# Patient Record
Sex: Female | Born: 1980 | Race: White | Hispanic: No | Marital: Married | Smoking: Never smoker
Health system: Southern US, Community
[De-identification: ages and names within clinical notes are randomized; demographics above are authoritative.]

## PROBLEM LIST (undated history)

## (undated) DIAGNOSIS — E079 Disorder of thyroid, unspecified: Secondary | ICD-10-CM

## (undated) HISTORY — DX: Disorder of thyroid, unspecified: E07.9

---

## 2007-04-22 ENCOUNTER — Inpatient Hospital Stay (HOSPITAL_COMMUNITY): Admission: RE | Admit: 2007-04-22 | Discharge: 2007-04-25 | Payer: Self-pay | Admitting: *Deleted

## 2007-09-13 ENCOUNTER — Ambulatory Visit: Payer: Self-pay | Admitting: Internal Medicine

## 2007-09-13 DIAGNOSIS — E039 Hypothyroidism, unspecified: Secondary | ICD-10-CM | POA: Insufficient documentation

## 2007-09-13 DIAGNOSIS — J45909 Unspecified asthma, uncomplicated: Secondary | ICD-10-CM | POA: Insufficient documentation

## 2007-09-18 LAB — CONVERTED CEMR LAB: TSH: 0.06 microintl units/mL — ABNORMAL LOW (ref 0.35–5.50)

## 2007-11-20 ENCOUNTER — Ambulatory Visit: Payer: Self-pay | Admitting: Internal Medicine

## 2007-11-21 LAB — CONVERTED CEMR LAB
Free T4: 0.9 ng/dL (ref 0.6–1.6)
TSH: 2.42 microintl units/mL (ref 0.35–5.50)

## 2008-06-16 ENCOUNTER — Telehealth: Payer: Self-pay | Admitting: Internal Medicine

## 2009-01-18 ENCOUNTER — Ambulatory Visit: Payer: Self-pay | Admitting: Internal Medicine

## 2009-10-11 ENCOUNTER — Ambulatory Visit: Payer: Self-pay | Admitting: Internal Medicine

## 2009-10-11 DIAGNOSIS — R5381 Other malaise: Secondary | ICD-10-CM | POA: Insufficient documentation

## 2009-10-11 DIAGNOSIS — R5383 Other fatigue: Secondary | ICD-10-CM

## 2009-10-13 LAB — CONVERTED CEMR LAB
Albumin: 4.2 g/dL (ref 3.5–5.2)
Alkaline Phosphatase: 43 units/L (ref 39–117)
Basophils Absolute: 0 10*3/uL (ref 0.0–0.1)
Basophils Relative: 0.1 % (ref 0.0–3.0)
Bilirubin, Direct: 0.1 mg/dL (ref 0.0–0.3)
Calcium: 9.3 mg/dL (ref 8.4–10.5)
Creatinine, Ser: 0.9 mg/dL (ref 0.4–1.2)
Eosinophils Absolute: 0.2 10*3/uL (ref 0.0–0.7)
Glucose, Bld: 86 mg/dL (ref 70–99)
Lymphocytes Relative: 26.9 % (ref 12.0–46.0)
MCHC: 33.9 g/dL (ref 30.0–36.0)
MCV: 90.8 fL (ref 78.0–100.0)
Monocytes Absolute: 0.5 10*3/uL (ref 0.1–1.0)
Neutrophils Relative %: 63.4 % (ref 43.0–77.0)
Phosphorus: 3.3 mg/dL (ref 2.3–4.6)
Platelets: 278 10*3/uL (ref 150.0–400.0)
Potassium: 4.2 meq/L (ref 3.5–5.1)
RBC: 4.09 M/uL (ref 3.87–5.11)
RDW: 13.4 % (ref 11.5–14.6)
Sed Rate: 9 mm/hr (ref 0–22)
Sodium: 140 meq/L (ref 135–145)
Total Bilirubin: 0.5 mg/dL (ref 0.3–1.2)
Total Protein: 7.3 g/dL (ref 6.0–8.3)

## 2009-10-18 ENCOUNTER — Encounter: Payer: Self-pay | Admitting: Internal Medicine

## 2009-12-13 ENCOUNTER — Encounter: Payer: Self-pay | Admitting: Internal Medicine

## 2010-03-08 ENCOUNTER — Ambulatory Visit: Payer: Self-pay | Admitting: Internal Medicine

## 2010-04-26 NOTE — Assessment & Plan Note (Signed)
Summary: FOLLOW UP WITH THYROID/ lb   Vital Signs:  Patient profile:   30 year old female Weight:      146 pounds Temp:     98.2 degrees F oral Pulse rate:   68 / minute Pulse rhythm:   regular BP sitting:   112 / 78  (left arm) Cuff size:   regular  Vitals Entered By: Lowella Petties CMA (October 11, 2009 4:45 PM) CC: Follow up with thyroid   History of Present Illness: Still feels that she may be hypothyroid  She notes a lack of energy--feels "drained" all the time takes a nap every day sleeps okay at night but has trouble initiating appetite is okay--has decreased since eating less Not overtly depressed--upset about fatigue and weight gain Not anhedonic--really enjoys her kids marriage is good  Notes a "brain fog" at times.  Hard to concentrate and gets a Runner, broadcasting/film/video"  Concerned that she is gaining weight clothes don't fit  Very heavy periods may go through supersized tampons in 1 hour Bleeds for 5 days every 24 days Gyn exam benign  Allergies (verified): No Known Drug Allergies  Past History:  Past medical, surgical, family and social histories (including risk factors) reviewed for relevance to current acute and chronic problems.  Past Medical History: Reviewed history from 09/13/2007 and no changes required. Hypothyroidism Asthma  Past Surgical History: Reviewed history from 09/13/2007 and no changes required. Wisdom teeth C-section x 3  Family History: Reviewed history from 09/13/2007 and no changes required. Doesn't know Dad Mom generally healthy. Some skin cancer 1 half brother, 1 half sister--has anemia No CAD, DM, HTN Mat GM died of pancreatic cancer Mat GF died of lymphoma No breast or colon cancer  Social History: Reviewed history from 09/13/2007 and no changes required. 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   Mouth:  no erythema, no exudates, and no lesions.   Neck:  supple, no masses, no thyromegaly, and no cervical lymphadenopathy.   Lungs:  normal respiratory effort, no intercostal retractions, no accessory muscle use, and normal breath sounds.   Heart:  normal rate, regular rhythm, no murmur, and no gallop.   Abdomen:  soft, non-tender, no masses, no hepatomegaly, and no splenomegaly.   Msk:  no joint tenderness and no joint swelling.   Pulses:  normal in feet Extremities:  no edema Skin:  no suspicious lesions.  Non specific scattered papules Psych:  normally interactive, good eye contact, not anxious appearing, and not depressed appearing.     Impression & Recommendations:  Problem # 1:  FATIGUE (ICD-780.79) Assessment New  multiple symptoms which are non specific could be inadequate thyroid replacement or anemia (with hypermenorrhea) will check labs doesn't seem to be from affective source  Orders: Venipuncture (56213) TLB-Renal Function Panel (80069-RENAL) TLB-CBC Platelet - w/Differential (85025-CBCD) TLB-Hepatic/Liver Function Pnl (80076-HEPATIC) TLB-Sedimentation Rate (ESR) (85652-ESR)  Problem # 2:  HYPOTHYROIDISM (ICD-244.9) Assessment: Unchanged  due for labs will adjust meds as needed   Her updated medication list for this problem includes:    Levothyroxine Sodium 50 Mcg Tabs (Levothyroxine sodium) .Marland Kitchen... Take 1 tablet daily  Labs Reviewed: TSH: 2.42 (11/20/2007)     Orders:  TLB-TSH (Thyroid Stimulating Hormone) (84443-TSH) TLB-T4 (Thyrox), Free [phone])  Complete Medication List: 1)  Levothyroxine Sodium 50 Mcg Tabs (Levothyroxine sodium) .... Take 1 tablet daily  Patient Instructions: 1)  Please schedule a follow-up appointment as needed .  2)  Please call  for reevaluation if not feeling better in the next 1-2 months 3)  Please try a daily multivitamin  Prior Medications (reviewed today): LEVOTHYROXINE SODIUM 50 MCG  TABS (LEVOTHYROXINE SODIUM) take 1 tablet daily Current Allergies (reviewed today): No known allergies

## 2010-04-26 NOTE — Letter (Signed)
Summary: [facility]  [facility]   Imported By: Maryln Gottron 12/27/2009 10:40:55  _____________________________________________________________________  External Attachment:    Type:   Image     Comment:   External Document  Appended Document: [facility] doing better on combo cytomel and levothyroxine

## 2010-04-26 NOTE — Consult Note (Signed)
Summary: [facility] Endocrinology  [facility] Endocrinology   Imported By: Lanelle Bal 10/25/2009 10:34:51  _____________________________________________________________________  External Attachment:    Type:   Image     Comment:   External Document  Appended Document: [facility] Endocr[facility]ology adding cytomel to the levothyroxine

## 2010-04-28 NOTE — Assessment & Plan Note (Signed)
Summary: COUGH,CONGESTION/CLE   Vital Signs:  Patient profile:   30 year old female Height:      67 inches Weight:      145.75 pounds BMI:     22.91 Temp:     97.8 degrees F oral Pulse rate:   76 / minute Pulse rhythm:   regular BP sitting:   108 / 78  (left arm) Cuff size:   regular  Vitals Entered By: Selena Batten Dance CMA Duncan Dull) (March 08, 2010 11:21 AM) CC: Cough and congestion >1 week   History of Present Illness: CC: cough  1 wk h/o cough.  Feels irritation in chest.  + deep cough.  No cold sxs to start out.  + more tired recently.  drinking plenty of water.  Cough worse at night.  (last night kept up coughing).  feels dry mostly but some productive.    No fevers/chills, abd pain, n/v, rashes, myalgias, arthralgias, ST, congestion, RN, sneezing.  no smokers at home.  + children at home, one sick.  husband just finished med for bronchitis.    + h/o asthma, h/o allergies. uses inhaler rarely.  Current Medications (verified): 1)  Levothroid 25 Mcg Tabs (Levothyroxine Sodium) .Marland Kitchen.. 1 By Mouth Once Daily 2)  Cytomel 5 Mcg Tabs (Liothyronine Sodium) .Marland Kitchen.. 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   Head:  mild maxillary sinus tenderness Eyes:  PERRLA, EOMI, no injection Ears:  R ear normal and L ear normal.   Nose:  mild congestion Mouth:  no erythema, no exudates, and no lesions.   Neck:  supple, no masses, no thyromegaly, and no cervical lymphadenopathy.   Lungs:  normal respiratory effort, no intercostal retractions, no accessory muscle use, and normal breath sounds.   Heart:  normal rate, regular rhythm, no murmur, and no  gallop.   Pulses:  2+ rad pulses Extremities:  no pedal edema Skin:  no suspicious lesions.  Non specific scattered papules   Impression & Recommendations:  Problem # 1:  COUGH (ICD-786.2) likely post viral given recent sick contacts (despite lack of typical prodrome x fatigue).  no indications of allergic or GERD.  supportive care for now, red flags to return discussed.  Complete Medication List: 1)  Levothroid 25 Mcg Tabs (Levothyroxine sodium) .Marland Kitchen.. 1 by mouth once daily 2)  Cytomel 5 Mcg Tabs (Liothyronine sodium) .Marland Kitchen.. 1 by mouth once daily 3)  Ventolin Hfa 108 (90 Base) Mcg/act Aers (Albuterol sulfate) .... 2 puffs q4-6 hours as needed cough/wheezing 4)  Cheratussin Ac 100-10 Mg/18ml Syrp (Guaifenesin-codeine) .... One teaspoon at bedtime as needed cough  Patient Instructions: 1)  Sounds like you have post-viral cough. 2)  Antibiotics are not needed for this.  Viral infections usually take 7-10 days to resolve.  The cough can last 4 weeks to go away. 3)  Use medication as prescribed: cheratussin for night time, mucinex or guaifenesin during day with plenty of fluid to mobilize any mucous that needs to come out. 4)  Use albuterol once or twice a day for next few days. 5)  Push fluids and plenty of rest. 6)  Please return if you are not improving as  expected, or if you have high fevers (>101.5) or difficulty swallowing. 7)  Call clinic with questions.  Pleasure to see you today!  Prescriptions: CHERATUSSIN AC 100-10 MG/5ML SYRP (GUAIFENESIN-CODEINE) one teaspoon at bedtime as needed cough  #100cc x 0   Entered and Authorized by:   Eustaquio Boyden  MD   Signed by:   Eustaquio Boyden  MD on 03/08/2010   Method used:   Print then Give to Patient   RxID:   [id number] VENTOLIN HFA 108 (90 BASE) MCG/ACT AERS (ALBUTEROL SULFATE) 2 puffs q4-6 hours as needed cough/wheezing  #1 x 3   Entered and Authorized by:   Eustaquio Boyden  MD   Signed by:   Eustaquio Boyden  MD on 03/08/2010    Method used:   Electronically to        [facility]  [address] (retail)       [address], [address]       [place], [place]  [address]       Ph: [phone]       Fax: [phone]   RxID:   [id number]    Orders Added: 1)  Est. Patient Level III [09323]    Current Allergies (reviewed today): No known allergies

## 2010-07-08 ENCOUNTER — Telehealth: Payer: Self-pay | Admitting: *Deleted

## 2010-07-08 NOTE — Telephone Encounter (Signed)
Pt was referred to an endocrinologist at [facility] for her thyroid problems, but she would like to see Leretha Pol at [facility] endocrin clinic.  She is asking for [place] to refer her.  Phone is [phone].

## 2010-07-08 NOTE — Telephone Encounter (Signed)
Will send to [facility].

## 2010-07-08 NOTE — Telephone Encounter (Signed)
Referral faxed to [facility] Endocrinology Dept. Patient was called and told to call them to schedule her own appt.

## 2010-08-09 NOTE — Op Note (Signed)
NAMEJAIMEY, [facility]             ACCOUNT NO.:  [id number]   MEDICAL RECORD NO.:  [id number]          PATIENT TYPE:  INP   LOCATION:  9142                          FACILITY:  [facility]   PHYSICIAN:  Gerri Spore B. Earlene Plater, M.D.  DATE OF BIRTH:  [birth year]   DATE OF PROCEDURE:  04/22/2007  DATE OF DISCHARGE:                               OPERATIVE REPORT   PREOPERATIVE DIAGNOSES:  1. 39-week intrauterine pregnancy.  2. Previous cesarean section.   POSTOPERATIVE DIAGNOSES:  1. 39-week intrauterine pregnancy.  2. Previous cesarean section.   PROCEDURE:  Repeat low transverse C-section.   SURGEON:  Marina Gravel, MD.   ASSISTANT:  Maxie Better, MD.   ANESTHESIA:  Spinal.   FINDINGS:  Viable female, 9 and 9 Apgars, 6 pounds 15 ounces. Normal-  appearing uterus, tubes and ovaries, minimal scarring of the bladder  flap to the lower uterine segment.   INDICATIONS:  Patient with above history for repeat C-section advised of  the risks of surgery including infection, bleeding, damage to  surrounding organs.   DESCRIPTION OF PROCEDURE:  The patient was taken to the operating room,  spinal anesthesia obtained.  She was prepped and draped in standard  fashion. A Foley catheter inserted to the bladder.  A Pfannenstiel  incision made and carried sharply to the fascia.  The fascia was divided  sharply and the underlying rectus muscles dissected off sharply.  The  posterior sheath elevated and entered sharply.  Bladder blade inserted,  bladder flap created sharply. Minimal scar tissue noted from bladder to  lower uterine segment.   The uterine incision made in low transverse fashion with a knife.  Clear  fluid and the amniotomy extended laterally bluntly.  Vertex elevated to  the incision and delivered with fundal pressure.  Nose and mouth  suctioned with the bulb, the remainder of the infant delivered without  difficulty.  The cord clamped and cut. The infant handed off to the  waiting  pediatricians.  1 gram Ancef given at cord clamp.   The placenta was removed by uterine massage.  The uterus was cleared of  all clots and debris.  The uterine incision was free of extension.  It  closed in one layer with running lock stitch of #0 chromic with  hemostasis obtained.  Tubes and ovaries were normal to palpation.   The pelvis with irrigated, uterine incision reinspected.  One bleeder  noted in the midline,  made hemostatic with a Bovie.   The fascia was closed with a running stitch of #0 Vicryl.  The  subcutaneous tissue was irrigated and made hemostatic with the Bovie.  The skin was closed with staples.   The patient tolerated the procedure well with no complications.  She was  taken to the recovery room awake, alert in stable condition.  All counts  correct per the operating staff.      Gerri Spore B. Earlene Plater, M.D.  Electronically Signed     WBD/MEDQ  D:  04/22/2007  T:  04/22/2007  Job:  [id number]

## 2010-08-09 NOTE — Discharge Summary (Signed)
NAMEDENICE, CARDON             ACCOUNT NO.:  [id number]   MEDICAL RECORD NO.:  [id number]          PATIENT TYPE:  INP   LOCATION:  9142                          FACILITY:  [facility]   PHYSICIAN:  Gerri Spore B. Earlene Plater, M.D.  DATE OF BIRTH:  1980/10/27   DATE OF ADMISSION:  04/22/2007  DATE OF DISCHARGE:  04/25/2007                               DISCHARGE SUMMARY   ADMISSION DIAGNOSES:  Previous C-section 39-week intrauterine pregnancy.   DISCHARGE DIAGNOSES:  Previous C-section 39-week intrauterine pregnancy.   PROCEDURE:  Repeat low transverse cesarean section.   HISTORY OF PRESENT ILLNESS:  A 30 year old white female, gravida 3, para  2, 39 weeks,  two previous C-sections presents for repeat cesarean  section.   HOSPITAL COURSE:  The patient was admitted and underwent repeat low  transverse C-section.  Findings at the time of surgery included a viable  female, Apgars 9 and 9, 6 pounds 15 ounces, normal appearing uterus,  tubes and ovaries.   Postoperatively the patient rapidly regained her ability to ambulate,  void and tolerate a regular diet.  She was discharged home on the third  postoperative day in satisfactory condition.   DISCHARGE INSTRUCTIONS:  Per booklet.   DISCHARGE MEDICATIONS:  1. Tylox 1-2 p.o. q. 4-6 hours p.r.n. pain.  2. Ferrous sulfate 325 mg p.o. daily.  3. Augmentin 875 one b.i.d. to complete a 10-day course for sinusitis.      Gerri Spore B. Earlene Plater, M.D.  Electronically Signed     WBD/MEDQ  D:  04/25/2007  T:  04/25/2007  Job:  [id number]

## 2010-10-26 HISTORY — PX: LAPAROSCOPIC APPENDECTOMY: SUR753

## 2010-11-18 ENCOUNTER — Encounter: Payer: Self-pay | Admitting: Internal Medicine

## 2010-12-16 LAB — DIFFERENTIAL
Eosinophils Absolute: 0.1
Eosinophils Relative: 1
Lymphocytes Relative: 10 — ABNORMAL LOW
Lymphs Abs: 1.3
Monocytes Relative: 4

## 2010-12-16 LAB — CBC
HCT: 28.1 — ABNORMAL LOW
HCT: 35.1 — ABNORMAL LOW
Hemoglobin: 11.8 — ABNORMAL LOW
MCV: 89.2
Platelets: 222
Platelets: 265
RBC: 3.94
WBC: 10.2
WBC: 12.6 — ABNORMAL HIGH

## 2010-12-16 LAB — RPR: RPR Ser Ql: NONREACTIVE

## 2011-04-28 LAB — HM PAP SMEAR: HM Pap smear: NORMAL

## 2011-05-29 ENCOUNTER — Encounter: Payer: Self-pay | Admitting: Internal Medicine

## 2013-06-25 ENCOUNTER — Ambulatory Visit (INDEPENDENT_AMBULATORY_CARE_PROVIDER_SITE_OTHER): Payer: BC Managed Care – HMO | Admitting: Internal Medicine

## 2013-06-25 ENCOUNTER — Encounter: Payer: Self-pay | Admitting: Internal Medicine

## 2013-06-25 VITALS — BP 102/68 | HR 76 | Temp 100.0°F | Wt 132.0 lb

## 2013-06-25 DIAGNOSIS — T3695XA Adverse effect of unspecified systemic antibiotic, initial encounter: Secondary | ICD-10-CM

## 2013-06-25 DIAGNOSIS — J020 Streptococcal pharyngitis: Secondary | ICD-10-CM

## 2013-06-25 DIAGNOSIS — B379 Candidiasis, unspecified: Secondary | ICD-10-CM

## 2013-06-25 LAB — POCT RAPID STREP A (OFFICE): Rapid Strep A Screen: NEGATIVE

## 2013-06-25 MED ORDER — LIDOCAINE VISCOUS 2 % MT SOLN: 20.0000 mL | OROMUCOSAL | Status: DC | PRN

## 2013-06-25 MED ORDER — FLUCONAZOLE 150 MG PO TABS: 150.0000 mg | ORAL_TABLET | Freq: Once | ORAL | Status: DC

## 2013-06-25 MED ORDER — AMOXICILLIN 500 MG PO CAPS: 500.0000 mg | ORAL_CAPSULE | Freq: Three times a day (TID) | ORAL | Status: DC

## 2013-06-25 NOTE — Patient Instructions (Addendum)
Strep Throat  Strep throat is an infection of the throat caused by a bacteria named Streptococcus pyogenes. Your caregiver may call the infection streptococcal "tonsillitis" or "pharyngitis" depending on whether there are signs of inflammation in the tonsils or back of the throat. Strep throat is most common in children aged 35 15 years during the cold months of the year, but it can occur in people of any age during any season. This infection is spread from person to person (contagious) through coughing, sneezing, or other close contact.  SYMPTOMS   · Fever or chills.  · Painful, swollen, red tonsils or throat.  · Pain or difficulty when swallowing.  · White or yellow spots on the tonsils or throat.  · Swollen, tender lymph nodes or "glands" of the neck or under the jaw.  · Red rash all over the body (rare).  DIAGNOSIS   Many different infections can cause the same symptoms. A test must be done to confirm the diagnosis so the right treatment can be given. A "rapid strep test" can help your caregiver make the diagnosis in a few minutes. If this test is not available, a light swab of the infected area can be used for a throat culture test. If a throat culture test is done, results are usually available in a day or two.  TREATMENT   Strep throat is treated with antibiotic medicine.  HOME CARE INSTRUCTIONS   · Gargle with 1 tsp of salt in 1 cup of warm water, 3 4 times per day or as needed for comfort.  · Family members who also have a sore throat or fever should be tested for strep throat and treated with antibiotics if they have the strep infection.  · Make sure everyone in your household washes their hands well.  · Do not share food, drinking cups, or personal items that could cause the infection to spread to others.  · You may need to eat a soft food diet until your sore throat gets better.  · Drink enough water and fluids to keep your urine clear or pale yellow. This will help prevent dehydration.  · Get plenty of  rest.  · Stay home from school, daycare, or work until you have been on antibiotics for 24 hours.  · Only take over-the-counter or prescription medicines for pain, discomfort, or fever as directed by your caregiver.  · If antibiotics are prescribed, take them as directed. Finish them even if you start to feel better.  SEEK MEDICAL CARE IF:   · The glands in your neck continue to enlarge.  · You develop a rash, cough, or earache.  · You cough up green, yellow-brown, or bloody sputum.  · You have pain or discomfort not controlled by medicines.  · Your problems seem to be getting worse rather than better.  SEEK IMMEDIATE MEDICAL CARE IF:   · You develop any new symptoms such as vomiting, severe headache, stiff or painful neck, chest pain, shortness of breath, or trouble swallowing.  · You develop severe throat pain, drooling, or changes in your voice.  · You develop swelling of the neck, or the skin on the neck becomes red and tender.  · You have a fever.  · You develop signs of dehydration, such as fatigue, dry mouth, and decreased urination.  · You become increasingly sleepy, or you cannot wake up completely.  Document Released: 03/10/2000 Document Revised: 02/28/2012 Document Reviewed: 05/12/2010  ExitCare® Patient Information ©2014 [employer].

## 2013-06-25 NOTE — Addendum Note (Signed)
Addended by: Roena MaladyEVONTENNO, Bryauna Byrum Y on: 06/25/2013 02:47 PM   Modules accepted: Orders

## 2013-06-25 NOTE — Progress Notes (Signed)
Pre visit review using our clinic review tool, if applicable. No additional management support is needed unless otherwise documented below in the visit note. 

## 2013-06-25 NOTE — Progress Notes (Signed)
HPI  Pt presents to the clinic today with c/o sore throat, bilateral ear pain and fever. She reports this started 4 days ago. She does have difficulty swallowing. The left ear hurts worse than the right. She has tried Ibuprofen OTC with little relief. She has no history of allergies or asthma. She has not had sick contacts.  Review of Systems      Past Medical History  Diagnosis Date  . Thyroid disease     No family history on file.  History   Social History  . Marital Status: Married    Spouse Name: N/A    Number of Children: 3  . Years of Education: N/A   Occupational History  . Not on file.   Social History Main Topics  . Smoking status: Never Smoker   . Smokeless tobacco: Not on file  . Alcohol Use: No  . Drug Use: Not on file  . Sexual Activity: Not on file   Other Topics Concern  . Not on file   Social History Narrative  . No narrative on file    No Known Allergies   Constitutional: Positive headache, fatigue and fever. Denies abrupt weight changes.  HEENT:  Positive sore throat and ear pain. Denies eye redness, eye pain, pressure behind the eyes, facial pain, nasal congestion,  ringing in the ears, wax buildup, runny nose or bloody nose. Respiratory:  Denies cough,  difficulty breathing or shortness of breath.  Cardiovascular: Denies chest pain, chest tightness, palpitations or swelling in the hands or feet.   No other specific complaints in a complete review of systems (except as listed in HPI above).  Objective:   BP 102/68  Pulse 76  Temp(Src) 100 F (37.8 C) (Oral)  Wt 132 lb (59.875 kg)  SpO2 98%  LMP 06/20/2013 Wt Readings from Last 3 Encounters:  06/25/13 132 lb (59.875 kg)  03/08/10 145 lb 12 oz (66.112 kg)  10/11/09 146 lb (66.225 kg)     General: Appears her stated age, well developed, well nourished in NAD. HEENT: Head: normal shape and size; Eyes: sclera white, no icterus, conjunctiva pink, PERRLA and EOMs intact; Ears: Tm's red  but intact, normal light reflex, + effusion bilaterally; Nose: mucosa pink and moist, septum midline; Throat/Mouth: + PND. Teeth present, mucosa erythematous and moist, tonsils 3 + with exudate noted on bilateral tonsillar pillars, no lesions or ulcerations noted.  Neck: Mild cervical lymphadenopathy. Neck supple, trachea midline. No massses, lumps or thyromegaly present.  Cardiovascular: Normal rate and rhythm. S1,S2 noted.  No murmur, rubs or gallops noted. No JVD or BLE edema. No carotid bruits noted. Pulmonary/Chest: Normal effort and positive vesicular breath sounds. No respiratory distress. No wheezes, rales or ronchi noted.      Assessment & Plan:   Sore throat:  RST: negative Get some rest and drink plenty of water Do salt water gargles for the sore throat Using Centor criteria, will treat with Amoxicillin TID x 10 days eRx for viscous lidocaine eRx for antibiotic induced yeast infection  RTC as needed or if symptoms persist.

## 2013-06-25 NOTE — Progress Notes (Signed)
HPI  Pt presents to the clinic today with c/o sore throat, otalgia, and fever. Symptoms started four days ago. She endorses having difficulty swallowing, chills, malaise, fever, and body aches. She denies cough, shortness of breath, or other cold symptoms. She has tried OTC Ibuprofen with some relief.   Review of Systems     No past medical history on file.  No family history on file.  History   Social History  . Marital Status: Married    Spouse Name: N/A    Number of Children: 3  . Years of Education: N/A   Occupational History  . Not on file.   Social History Main Topics  . Smoking status: Never Smoker   . Smokeless tobacco: Not on file  . Alcohol Use: No  . Drug Use: Not on file  . Sexual Activity: Not on file   Other Topics Concern  . Not on file   Social History Narrative  . No narrative on file    Allergies not on file   Constitutional: Positive fatigue, malaise, and fever. Denies abrupt weight changes.  HEENT:  Positive sore throat. Denies eye redness, eye pain, pressure behind the eyes, facial pain, nasal congestion, ear pain, ringing in the ears, wax buildup, runny nose or bloody nose. Respiratory: . Denies cough, difficulty breathing or shortness of breath.  Cardiovascular: Denies chest pain, chest tightness, palpitations or swelling in the hands or feet.   No other specific complaints in a complete review of systems (except as listed in HPI above).  Objective:   Wt 132 lb (59.875 kg) Wt Readings from Last 3 Encounters:  06/25/13 132 lb (59.875 kg)  03/08/10 145 lb 12 oz (66.112 kg)  10/11/09 146 lb (66.225 kg)     General: Appears his stated age, well developed, well nourished in NAD. HEENT: Head: normal shape and size; Eyes: sclera white, no icterus, conjunctiva pink, PERRLA and EOMs intact; Ears: Bilateral effusions; clear serous fluid. Light reflex distorted. Nose: mucosa pink and moist, septum midline; Throat/Mouth: + PND. Teeth present, mucosa  erythematous and moist, Tonsils 3+ bilaterally with white  exudate noted, no lesions or ulcerations noted.  Neck: Mild cervical lymphadenopathy. Neck supple, trachea midline. No massses, lumps or thyromegaly present.  Cardiovascular: Normal rate and rhythm. S1,S2 noted.  No murmur, rubs or gallops noted. No JVD or BLE edema. No carotid bruits noted. Pulmonary/Chest: Normal effort and positive vesicular breath sounds. No respiratory distress. No wheezes, rales or ronchi noted.      Assessment & Plan:   Strep throat:  Rapid strep negative; Will treat based on Centor Criteria eRx for Amoxicillin for 10days  eRx for Viscous Lidocaine for pain Gargle with warm salt water Rest and drink plenty of fluids Follow up in 3-5 days if no better  RTC as needed or if symptoms persist.  Demico Ploch, Jacques Earthlyourtney S, Student-NP

## 2013-10-10 ENCOUNTER — Encounter: Payer: Self-pay | Admitting: Internal Medicine

## 2013-10-10 ENCOUNTER — Ambulatory Visit (INDEPENDENT_AMBULATORY_CARE_PROVIDER_SITE_OTHER): Payer: Managed Care, Other (non HMO) | Admitting: Internal Medicine

## 2013-10-10 VITALS — BP 98/62 | HR 71 | Temp 97.9°F | Ht 67.0 in | Wt 136.0 lb

## 2013-10-10 DIAGNOSIS — Z0000 Encounter for general adult medical examination without abnormal findings: Secondary | ICD-10-CM

## 2013-10-10 DIAGNOSIS — E039 Hypothyroidism, unspecified: Secondary | ICD-10-CM

## 2013-10-10 DIAGNOSIS — Z111 Encounter for screening for respiratory tuberculosis: Secondary | ICD-10-CM

## 2013-10-10 NOTE — Progress Notes (Signed)
Subjective:    Patient ID: Margaret Frederick, female    DOB: 1980/12/16, 33 y.o.   MRN: [id number]  HPI  Pt presents to the clinic today for her annual exam. She would like to be tested for TB. She reports that she recently traveled to an orphanage in [place] where there may have been some children with TB. She has no s/s at this time.  Flu: never Tetanus: 2009 LMP: 09/30/13 Pap Smear: 2013 Dentist: [email or web address]  Review of Systems      Past Medical History  Diagnosis Date  . Thyroid disease     Current Outpatient Prescriptions  Medication Sig Dispense Refill  . levothyroxine (SYNTHROID, LEVOTHROID) 50 MCG tablet Take 50 mcg by mouth daily before breakfast. 2 days a week--take 2 tablets       No current facility-administered medications for this visit.    No Known Allergies  No family history on file.  History   Social History  . Marital Status: Married    Spouse Name: N/A    Number of Children: 3  . Years of Education: N/A   Occupational History  . Not on file.   Social History Main Topics  . Smoking status: Never Smoker   . Smokeless tobacco: Not on file  . Alcohol Use: No  . Drug Use: Not on file  . Sexual Activity: Not on file   Other Topics Concern  . Not on file   Social History Narrative  . No narrative on file     Constitutional: Denies fever, malaise, fatigue, headache or abrupt weight changes.  HEENT: Denies eye pain, eye redness, ear pain, ringing in the ears, wax buildup, runny nose, nasal congestion, bloody nose, or sore throat. Respiratory: Denies difficulty breathing, shortness of breath, cough or sputum production.   Cardiovascular: Denies chest pain, chest tightness, palpitations or swelling in the hands or feet.  Gastrointestinal: Denies abdominal pain, bloating, constipation, diarrhea or blood in the stool.  GU: Denies urgency, frequency, pain with urination, burning sensation, blood in urine, odor or discharge. Musculoskeletal: Denies  decrease in range of motion, difficulty with gait, muscle pain or joint pain and swelling.  Skin: Denies redness, rashes, lesions or ulcercations.  Neurological: Denies dizziness, difficulty with memory, difficulty with speech or problems with balance and coordination.   No other specific complaints in a complete review of systems (except as listed in HPI above).  Objective:   Physical Exam  BP 98/62  Pulse 71  Temp(Src) 97.9 F (36.6 C) (Oral)  Ht 5\' 7"  (1.702 m)  Wt 136 lb (61.689 kg)  BMI 21.30 kg/m2  SpO2 98%  LMP 09/30/2013 Wt Readings from Last 3 Encounters:  10/10/13 136 lb (61.689 kg)  06/25/13 132 lb (59.875 kg)  03/08/10 145 lb 12 oz (66.112 kg)    General: Appears her stated age, well developed, well nourished in NAD. Skin: Warm, dry and intact. No rashes, lesions or ulcerations noted. HEENT: Head: normal shape and size; Eyes: sclera white, no icterus, conjunctiva pink, PERRLA and EOMs intact; Ears: Tm's gray and intact, normal light reflex; Nose: mucosa pink and moist, septum midline; Throat/Mouth: Teeth present, mucosa pink and moist, no exudate, lesions or ulcerations noted.  Neck: Normal range of motion. Neck supple, trachea midline. No massses, lumps or thyromegaly present.  Cardiovascular: Normal rate and rhythm. S1,S2 noted.  No murmur, rubs or gallops noted. No JVD or BLE edema. No carotid bruits noted. Pulmonary/Chest: Normal effort and positive vesicular breath sounds. No respiratory distress.  No wheezes, rales or ronchi noted.  Abdomen: Soft and nontender. Normal bowel sounds, no bruits noted. No distention or masses noted. Liver, spleen and kidneys non palpable. Musculoskeletal: Normal range of motion. No signs of joint swelling. No difficulty with gait.  Neurological: Alert and oriented. Cranial nerves II-XII intact. Coordination normal. +DTRs bilaterally. Psychiatric: Mood and affect normal. Behavior is normal. Judgment and thought content normal.      BMET    Component Value Date/Time   NA 140 10/11/2009 1709   K 4.2 10/11/2009 1709   CL 108 10/11/2009 1709   CO2 30 10/11/2009 1709   GLUCOSE 86 10/11/2009 1709   BUN 13 10/11/2009 1709   CREATININE 0.9 10/11/2009 1709   CALCIUM 9.3 10/11/2009 1709   GFRNONAA 76.62 10/11/2009 1709    CBC    Component Value Date/Time   WBC 7.2 10/11/2009 1709   RBC 4.09 10/11/2009 1709   HGB 12.6 10/11/2009 1709   HCT 37.1 10/11/2009 1709   PLT 278.0 10/11/2009 1709   MCV 90.8 10/11/2009 1709   MCHC 33.9 10/11/2009 1709   RDW 13.4 10/11/2009 1709   LYMPHSABS 1.9 10/11/2009 1709   MONOABS 0.5 10/11/2009 1709   EOSABS 0.2 10/11/2009 1709   BASOSABS 0.0 10/11/2009 1709          Assessment & Plan:   Preventative Health Maintenance:  All HM UTD Will check CBC, CMET and lipid She does see endo for her TSH Will check for TB through serum  RTC in 1 year or sooner if needed

## 2013-10-10 NOTE — Patient Instructions (Addendum)
Preventive Care for Adults A healthy lifestyle and preventive care can promote health and wellness. Preventive health guidelines for women include the following key practices.  A routine yearly physical is a good way to check with your health care provider about your health and preventive screening. It is a chance to share any concerns and updates on your health and to receive a thorough exam.  Visit your dentist for a routine exam and preventive care every 6 months. Brush your teeth twice a day and floss once a day. Good oral hygiene prevents tooth decay and gum disease.  The frequency of eye exams is based on your age, health, family medical history, use of contact lenses, and other factors. Follow your health care provider's recommendations for frequency of eye exams.  Eat a healthy diet. Foods like vegetables, fruits, whole grains, low-fat dairy products, and lean protein foods contain the nutrients you need without too many calories. Decrease your intake of foods high in solid fats, added sugars, and salt. Eat the right amount of calories for you.Get information about a proper diet from your health care provider, if necessary.  Regular physical exercise is one of the most important things you can do for your health. Most adults should get at least 150 minutes of moderate-intensity exercise (any activity that increases your heart rate and causes you to sweat) each week. In addition, most adults need muscle-strengthening exercises on 2 or more days a week.  Maintain a healthy weight. The body mass index (BMI) is a screening tool to identify possible weight problems. It provides an estimate of body fat based on height and weight. Your health care provider can find your BMI, and can help you achieve or maintain a healthy weight.For adults 20 years and older:  A BMI below 18.5 is considered underweight.  A BMI of 18.5 to 24.9 is normal.  A BMI of 25 to 29.9 is considered overweight.  A BMI of  30 and above is considered obese.  Maintain normal blood lipids and cholesterol levels by exercising and minimizing your intake of saturated fat. Eat a balanced diet with plenty of fruit and vegetables. Blood tests for lipids and cholesterol should begin at age 52 and be repeated every 5 years. If your lipid or cholesterol levels are high, you are over 50, or you are at high risk for heart disease, you may need your cholesterol levels checked more frequently.Ongoing high lipid and cholesterol levels should be treated with medicines if diet and exercise are not working.  If you smoke, find out from your health care provider how to quit. If you do not use tobacco, do not start.  Lung cancer screening is recommended for adults aged 37-80 years who are at high risk for developing lung cancer because of a history of smoking. A yearly low-dose CT scan of the lungs is recommended for people who have at least a 30-pack-year history of smoking and are a current smoker or have quit within the past 15 years. A pack year of smoking is smoking an average of 1 pack of cigarettes a day for 1 year (for example: 1 pack a day for 30 years or 2 packs a day for 15 years). Yearly screening should continue until the smoker has stopped smoking for at least 15 years. Yearly screening should be stopped for people who develop a health problem that would prevent them from having lung cancer treatment.  If you are pregnant, do not drink alcohol. If you are breastfeeding,  be very cautious about drinking alcohol. If you are not pregnant and choose to drink alcohol, do not have more than 1 drink per day. One drink is considered to be 12 ounces (355 mL) of beer, 5 ounces (148 mL) of wine, or 1.5 ounces (44 mL) of liquor.  Avoid use of street drugs. Do not share needles with anyone. Ask for help if you need support or instructions about stopping the use of drugs.  High blood pressure causes heart disease and increases the risk of  stroke. Your blood pressure should be checked at least every 1 to 2 years. Ongoing high blood pressure should be treated with medicines if weight loss and exercise do not work.  If you are 3-86 years old, ask your health care provider if you should take aspirin to prevent strokes.  Diabetes screening involves taking a blood sample to check your fasting blood sugar level. This should be done once every 3 years, after age 67, if you are within normal weight and without risk factors for diabetes. Testing should be considered at a younger age or be carried out more frequently if you are overweight and have at least 1 risk factor for diabetes.  Breast cancer screening is essential preventive care for women. You should practice "breast self-awareness." This means understanding the normal appearance and feel of your breasts and may include breast self-examination. Any changes detected, no matter how small, should be reported to a health care provider. Women in their 8s and 30s should have a clinical breast exam (CBE) by a health care provider as part of a regular health exam every 1 to 3 years. After age 70, women should have a CBE every year. Starting at age 25, women should consider having a mammogram (breast X-ray test) every year. Women who have a family history of breast cancer should talk to their health care provider about genetic screening. Women at a high risk of breast cancer should talk to their health care providers about having an MRI and a mammogram every year.  Breast cancer gene (BRCA)-related cancer risk assessment is recommended for women who have family members with BRCA-related cancers. BRCA-related cancers include breast, ovarian, tubal, and peritoneal cancers. Having family members with these cancers may be associated with an increased risk for harmful changes (mutations) in the breast cancer genes BRCA1 and BRCA2. Results of the assessment will determine the need for genetic counseling and  BRCA1 and BRCA2 testing.  Routine pelvic exams to screen for cancer are no longer recommended for nonpregnant women who are considered low risk for cancer of the pelvic organs (ovaries, uterus, and vagina) and who do not have symptoms. Ask your health care provider if a screening pelvic exam is right for you.  If you have had past treatment for cervical cancer or a condition that could lead to cancer, you need Pap tests and screening for cancer for at least 20 years after your treatment. If Pap tests have been discontinued, your risk factors (such as having a new sexual partner) need to be reassessed to determine if screening should be resumed. Some women have medical problems that increase the chance of getting cervical cancer. In these cases, your health care provider may recommend more frequent screening and Pap tests.  The HPV test is an additional test that may be used for cervical cancer screening. The HPV test looks for the virus that can cause the cell changes on the cervix. The cells collected during the Pap test can be  tested for HPV. The HPV test could be used to screen women aged 47 years and older, and should be used in women of any age who have unclear Pap test results. After the age of 36, women should have HPV testing at the same frequency as a Pap test.  Colorectal cancer can be detected and often prevented. Most routine colorectal cancer screening begins at the age of 38 years and continues through age 58 years. However, your health care provider may recommend screening at an earlier age if you have risk factors for colon cancer. On a yearly basis, your health care provider may provide home test kits to check for hidden blood in the stool. Use of a small camera at the end of a tube, to directly examine the colon (sigmoidoscopy or colonoscopy), can detect the earliest forms of colorectal cancer. Talk to your health care provider about this at age 64, when routine screening begins. Direct  exam of the colon should be repeated every 5-10 years through age 21 years, unless early forms of pre-cancerous polyps or small growths are found.  People who are at an increased risk for hepatitis B should be screened for this virus. You are considered at high risk for hepatitis B if:  You were born in a country where hepatitis B occurs often. Talk with your health care provider about which countries are considered high risk.  Your parents were born in a high-risk country and you have not received a shot to protect against hepatitis B (hepatitis B vaccine).  You have HIV or AIDS.  You use needles to inject street drugs.  You live with, or have sex with, someone who has Hepatitis B.  You get hemodialysis treatment.  You take certain medicines for conditions like cancer, organ transplantation, and autoimmune conditions.  Hepatitis C blood testing is recommended for all people born from [birth year] through 1965 and any individual with known risks for hepatitis C.  Practice safe sex. Use condoms and avoid high-risk sexual practices to reduce the spread of sexually transmitted infections (STIs). STIs include gonorrhea, chlamydia, syphilis, trichomonas, herpes, HPV, and human immunodeficiency virus (HIV). Herpes, HIV, and HPV are viral illnesses that have no cure. They can result in disability, cancer, and death.  You should be screened for sexually transmitted illnesses (STIs) including gonorrhea and chlamydia if:  You are sexually active and are younger than 24 years.  You are older than 24 years and your health care provider tells you that you are at risk for this type of infection.  Your sexual activity has changed since you were last screened and you are at an increased risk for chlamydia or gonorrhea. Ask your health care provider if you are at risk.  If you are at risk of being infected with HIV, it is recommended that you take a prescription medicine daily to prevent HIV infection. This is  called preexposure prophylaxis (PrEP). You are considered at risk if:  You are a heterosexual woman, are sexually active, and are at increased risk for HIV infection.  You take drugs by injection.  You are sexually active with a partner who has HIV.  Talk with your health care provider about whether you are at high risk of being infected with HIV. If you choose to begin PrEP, you should first be tested for HIV. You should then be tested every 3 months for as long as you are taking PrEP.  Osteoporosis is a disease in which the bones lose minerals and strength  with aging. This can result in serious bone fractures or breaks. The risk of osteoporosis can be identified using a bone density scan. Women ages 65 years and over and women at risk for fractures or osteoporosis should discuss screening with their health care providers. Ask your health care provider whether you should take a calcium supplement or vitamin D to reduce the rate of osteoporosis.  Menopause can be associated with physical symptoms and risks. Hormone replacement therapy is available to decrease symptoms and risks. You should talk to your health care provider about whether hormone replacement therapy is right for you.  Use sunscreen. Apply sunscreen liberally and repeatedly throughout the day. You should seek shade when your shadow is shorter than you. Protect yourself by wearing long sleeves, pants, a wide-brimmed hat, and sunglasses year round, whenever you are outdoors.  Once a month, do a whole body skin exam, using a mirror to look at the skin on your back. Tell your health care provider of new moles, moles that have irregular borders, moles that are larger than a pencil eraser, or moles that have changed in shape or color.  Stay current with required vaccines (immunizations).  Influenza vaccine. All adults should be immunized every year.  Tetanus, diphtheria, and acellular pertussis (Td, Tdap) vaccine. Pregnant women should  receive 1 dose of Tdap vaccine during each pregnancy. The dose should be obtained regardless of the length of time since the last dose. Immunization is preferred during the 27th-36th week of gestation. An adult who has not previously received Tdap or who does not know her vaccine status should receive 1 dose of Tdap. This initial dose should be followed by tetanus and diphtheria toxoids (Td) booster doses every 10 years. Adults with an unknown or incomplete history of completing a 3-dose immunization series with Td-containing vaccines should begin or complete a primary immunization series including a Tdap dose. Adults should receive a Td booster every 10 years.  Varicella vaccine. An adult without evidence of immunity to varicella should receive 2 doses or a second dose if she has previously received 1 dose. Pregnant females who do not have evidence of immunity should receive the first dose after pregnancy. This first dose should be obtained before leaving the health care facility. The second dose should be obtained 4-8 weeks after the first dose.  Human papillomavirus (HPV) vaccine. Females aged 13-26 years who have not received the vaccine previously should obtain the 3-dose series. The vaccine is not recommended for use in pregnant females. However, pregnancy testing is not needed before receiving a dose. If a female is found to be pregnant after receiving a dose, no treatment is needed. In that case, the remaining doses should be delayed until after the pregnancy. Immunization is recommended for any person with an immunocompromised condition through the age of 26 years if she did not get any or all doses earlier. During the 3-dose series, the second dose should be obtained 4-8 weeks after the first dose. The third dose should be obtained 24 weeks after the first dose and 16 weeks after the second dose.  Zoster vaccine. One dose is recommended for adults aged 60 years or older unless certain conditions are  present.  Measles, mumps, and rubella (MMR) vaccine. Adults born before 1957 generally are considered immune to measles and mumps. Adults born in 1957 or later should have 1 or more doses of MMR vaccine unless there is a contraindication to the vaccine or there is laboratory evidence of immunity to   each of the three diseases. A routine second dose of MMR vaccine should be obtained at least 28 days after the first dose for students attending postsecondary schools, health care workers, or international travelers. People who received inactivated measles vaccine or an unknown type of measles vaccine during 1963-1967 should receive 2 doses of MMR vaccine. People who received inactivated mumps vaccine or an unknown type of mumps vaccine before 1979 and are at high risk for mumps infection should consider immunization with 2 doses of MMR vaccine. For females of childbearing age, rubella immunity should be determined. If there is no evidence of immunity, females who are not pregnant should be vaccinated. If there is no evidence of immunity, females who are pregnant should delay immunization until after pregnancy. Unvaccinated health care workers born before 1957 who lack laboratory evidence of measles, mumps, or rubella immunity or laboratory confirmation of disease should consider measles and mumps immunization with 2 doses of MMR vaccine or rubella immunization with 1 dose of MMR vaccine.  Pneumococcal 13-valent conjugate (PCV13) vaccine. When indicated, a person who is uncertain of her immunization history and has no record of immunization should receive the PCV13 vaccine. An adult aged 19 years or older who has certain medical conditions and has not been previously immunized should receive 1 dose of PCV13 vaccine. This PCV13 should be followed with a dose of pneumococcal polysaccharide (PPSV23) vaccine. The PPSV23 vaccine dose should be obtained at least 8 weeks after the dose of PCV13 vaccine. An adult aged 19  years or older who has certain medical conditions and previously received 1 or more doses of PPSV23 vaccine should receive 1 dose of PCV13. The PCV13 vaccine dose should be obtained 1 or more years after the last PPSV23 vaccine dose.  Pneumococcal polysaccharide (PPSV23) vaccine. When PCV13 is also indicated, PCV13 should be obtained first. All adults aged 65 years and older should be immunized. An adult younger than age 65 years who has certain medical conditions should be immunized. Any person who resides in a nursing home or long-term care facility should be immunized. An adult smoker should be immunized. People with an immunocompromised condition and certain other conditions should receive both PCV13 and PPSV23 vaccines. People with human immunodeficiency virus (HIV) infection should be immunized as soon as possible after diagnosis. Immunization during chemotherapy or radiation therapy should be avoided. Routine use of PPSV23 vaccine is not recommended for American Indians, [place] Natives, or people younger than 65 years unless there are medical conditions that require PPSV23 vaccine. When indicated, people who have unknown immunization and have no record of immunization should receive PPSV23 vaccine. One-time revaccination 5 years after the first dose of PPSV23 is recommended for people aged 19-64 years who have chronic kidney failure, nephrotic syndrome, asplenia, or immunocompromised conditions. People who received 1-2 doses of PPSV23 before age 65 years should receive another dose of PPSV23 vaccine at age 65 years or later if at least 5 years have passed since the previous dose. Doses of PPSV23 are not needed for people immunized with PPSV23 at or after age 65 years.  Meningococcal vaccine. Adults with asplenia or persistent complement component deficiencies should receive 2 doses of quadrivalent meningococcal conjugate (MenACWY-D) vaccine. The doses should be obtained at least 2 months apart.  Microbiologists working with certain meningococcal bacteria, military recruits, people at risk during an outbreak, and people who travel to or live in countries with a high rate of meningitis should be immunized. A first-year college student up through age   21 years who is living in a residence hall should receive a dose if she did not receive a dose on or after her 16th birthday. Adults who have certain high-risk conditions should receive one or more doses of vaccine.  Hepatitis A vaccine. Adults who wish to be protected from this disease, have certain high-risk conditions, work with hepatitis A-infected animals, work in hepatitis A research labs, or travel to or work in countries with a high rate of hepatitis A should be immunized. Adults who were previously unvaccinated and who anticipate close contact with an international adoptee during the first 60 days after arrival in the [place] from a country with a high rate of hepatitis A should be immunized.  Hepatitis B vaccine. Adults who wish to be protected from this disease, have certain high-risk conditions, may be exposed to blood or other infectious body fluids, are household contacts or sex partners of hepatitis B positive people, are clients or workers in certain care facilities, or travel to or work in countries with a high rate of hepatitis B should be immunized.  Haemophilus influenzae type b (Hib) vaccine. A previously unvaccinated person with asplenia or sickle cell disease or having a scheduled splenectomy should receive 1 dose of Hib vaccine. Regardless of previous immunization, a recipient of a hematopoietic stem cell transplant should receive a 3-dose series 6-12 months after her successful transplant. Hib vaccine is not recommended for adults with HIV infection. Preventive Services / Frequency Ages 43 to 39years  Blood pressure check.** / Every 1 to 2 years.  Lipid and cholesterol check.** / Every 5 years beginning at age  75.  Clinical breast exam.** / Every 3 years for women in their 32s and 74s.  BRCA-related cancer risk assessment.** / For women who have family members with a BRCA-related cancer (breast, ovarian, tubal, or peritoneal cancers).  Pap test.** / Every 2 years from ages 65 through 91. Every 3 years starting at age 34 through age 93 or 72 with a history of 3 consecutive normal Pap tests.  HPV screening.** / Every 3 years from ages 46 through ages 53 to 26 with a history of 3 consecutive normal Pap tests.  Hepatitis C blood test.** / For any individual with known risks for hepatitis C.  Skin self-exam. / Monthly.  Influenza vaccine. / Every year.  Tetanus, diphtheria, and acellular pertussis (Tdap, Td) vaccine.** / Consult your health care provider. Pregnant women should receive 1 dose of Tdap vaccine during each pregnancy. 1 dose of Td every 10 years.  Varicella vaccine.** / Consult your health care provider. Pregnant females who do not have evidence of immunity should receive the first dose after pregnancy.  HPV vaccine. / 3 doses over 6 months, if 70 and younger. The vaccine is not recommended for use in pregnant females. However, pregnancy testing is not needed before receiving a dose.  Measles, mumps, rubella (MMR) vaccine.** / You need at least 1 dose of MMR if you were born in 1957 or later. You may also need a 2nd dose. For females of childbearing age, rubella immunity should be determined. If there is no evidence of immunity, females who are not pregnant should be vaccinated. If there is no evidence of immunity, females who are pregnant should delay immunization until after pregnancy.  Pneumococcal 13-valent conjugate (PCV13) vaccine.** / Consult your health care provider.  Pneumococcal polysaccharide (PPSV23) vaccine.** / 1 to 2 doses if you smoke cigarettes or if you have certain conditions.  Meningococcal vaccine.** /  1 dose if you are age 70 to 51 years and a [place] living in a residence hall, or have one of several medical conditions, you need to get vaccinated against meningococcal disease. You may also need additional booster doses.  Hepatitis A vaccine.** / Consult your health care provider.  Hepatitis B vaccine.** / Consult your health care provider.  Haemophilus influenzae type b (Hib) vaccine.** / Consult your health care provider. Ages 40 to 64years  Blood pressure check.** / Every 1 to 2 years.  Lipid and cholesterol check.** / Every 5 years beginning at age 58 years.  Lung cancer screening. / Every year if you are aged 56-80 years and have a 30-pack-year history of smoking and currently smoke or have quit within the past 15 years. Yearly screening is stopped once you have quit smoking for at least 15 years or develop a health problem that would prevent you from having lung cancer treatment.  Clinical breast exam.** / Every year after age 35 years.  BRCA-related cancer risk assessment.** / For women who have family members with a BRCA-related cancer (breast, ovarian, tubal, or peritoneal cancers).  Mammogram.** / Every year beginning at age 109 years and continuing for as long as you are in good health. Consult with your health care provider.  Pap test.** / Every 3 years starting at age 44 years through age 94 or 70 years with a history of 3 consecutive normal Pap tests.  HPV screening.** / Every 3 years from ages 109 years through ages 50 to 30 years with a history of 3 consecutive normal Pap tests.  Fecal occult blood test (FOBT) of stool. / Every year beginning at age 73 years and continuing until age 59 years. You may not need to do this test if you get a colonoscopy every 10 years.  Flexible sigmoidoscopy or colonoscopy.** / Every 5 years for a flexible sigmoidoscopy or every 10 years for a colonoscopy beginning at age 68 years and continuing until age 12 years.  Hepatitis C blood test.** / For all people born from [birth year] through  1965 and any individual with known risks for hepatitis C.  Skin self-exam. / Monthly.  Influenza vaccine. / Every year.  Tetanus, diphtheria, and acellular pertussis (Tdap/Td) vaccine.** / Consult your health care provider. Pregnant women should receive 1 dose of Tdap vaccine during each pregnancy. 1 dose of Td every 10 years.  Varicella vaccine.** / Consult your health care provider. Pregnant females who do not have evidence of immunity should receive the first dose after pregnancy.  Zoster vaccine.** / 1 dose for adults aged 2 years or older.  Measles, mumps, rubella (MMR) vaccine.** / You need at least 1 dose of MMR if you were born in 1957 or later. You may also need a 2nd dose. For females of childbearing age, rubella immunity should be determined. If there is no evidence of immunity, females who are not pregnant should be vaccinated. If there is no evidence of immunity, females who are pregnant should delay immunization until after pregnancy.  Pneumococcal 13-valent conjugate (PCV13) vaccine.** / Consult your health care provider.  Pneumococcal polysaccharide (PPSV23) vaccine.** / 1 to 2 doses if you smoke cigarettes or if you have certain conditions.  Meningococcal vaccine.** / Consult your health care provider.  Hepatitis A vaccine.** / Consult your health care provider.  Hepatitis B vaccine.** / Consult your health care provider.  Haemophilus influenzae type b (Hib) vaccine.** / Consult your health care provider. Ages 48 years  and over  Blood pressure check.** / Every 1 to 2 years.  Lipid and cholesterol check.** / Every 5 years beginning at age 84 years.  Lung cancer screening. / Every year if you are aged 50-80 years and have a 30-pack-year history of smoking and currently smoke or have quit within the past 15 years. Yearly screening is stopped once you have quit smoking for at least 15 years or develop a health problem that would prevent you from having lung cancer  treatment.  Clinical breast exam.** / Every year after age 24 years.  BRCA-related cancer risk assessment.** / For women who have family members with a BRCA-related cancer (breast, ovarian, tubal, or peritoneal cancers).  Mammogram.** / Every year beginning at age 14 years and continuing for as long as you are in good health. Consult with your health care provider.  Pap test.** / Every 3 years starting at age 17 years through age 31 or 74 years with 3 consecutive normal Pap tests. Testing can be stopped between 65 and 70 years with 3 consecutive normal Pap tests and no abnormal Pap or HPV tests in the past 10 years.  HPV screening.** / Every 3 years from ages 30 years through ages 70 or 28 years with a history of 3 consecutive normal Pap tests. Testing can be stopped between 65 and 70 years with 3 consecutive normal Pap tests and no abnormal Pap or HPV tests in the past 10 years.  Fecal occult blood test (FOBT) of stool. / Every year beginning at age 64 years and continuing until age 92 years. You may not need to do this test if you get a colonoscopy every 10 years.  Flexible sigmoidoscopy or colonoscopy.** / Every 5 years for a flexible sigmoidoscopy or every 10 years for a colonoscopy beginning at age 73 years and continuing until age 39 years.  Hepatitis C blood test.** / For all people born from [birth year] through 1967 and any individual with known risks for hepatitis C.  Osteoporosis screening.** / A one-time screening for women ages 35 years and over and women at risk for fractures or osteoporosis.  Skin self-exam. / Monthly.  Influenza vaccine. / Every year.  Tetanus, diphtheria, and acellular pertussis (Tdap/Td) vaccine.** / 1 dose of Td every 10 years.  Varicella vaccine.** / Consult your health care provider.  Zoster vaccine.** / 1 dose for adults aged 59 years or older.  Pneumococcal 13-valent conjugate (PCV13) vaccine.** / Consult your health care provider.  Pneumococcal  polysaccharide (PPSV23) vaccine.** / 1 dose for all adults aged 8 years and older.  Meningococcal vaccine.** / Consult your health care provider.  Hepatitis A vaccine.** / Consult your health care provider.  Hepatitis B vaccine.** / Consult your health care provider.  Haemophilus influenzae type b (Hib) vaccine.** / Consult your health care provider. ** Family history and personal history of risk and conditions may change your health care provider's recommendations. Document Released: 05/09/2001 Document Revised: 03/18/2013 Document Reviewed: 08/08/2010 [facility] Patient Information 2015 [place], [place]. This information is not intended to replace advice given to you by your health care provider. Make sure you discuss any questions you have with your health care provider.

## 2013-10-10 NOTE — Progress Notes (Signed)
Pre visit review using our clinic review tool, if applicable. No additional management support is needed unless otherwise documented below in the visit note. 

## 2013-10-11 LAB — COMPREHENSIVE METABOLIC PANEL
ALT: 10 U/L (ref 0–35)
AST: 14 U/L (ref 0–37)
Albumin: 4 g/dL (ref 3.5–5.2)
Alkaline Phosphatase: 39 U/L (ref 39–117)
BILIRUBIN TOTAL: 0.4 mg/dL (ref 0.2–1.2)
BUN: 13 mg/dL (ref 6–23)
CO2: 28 meq/L (ref 19–32)
CREATININE: 0.79 mg/dL (ref 0.50–1.10)
Calcium: 9 mg/dL (ref 8.4–10.5)
Chloride: 102 mEq/L (ref 96–112)
GLUCOSE: 77 mg/dL (ref 70–99)
Potassium: 4.1 mEq/L (ref 3.5–5.3)
Sodium: 137 mEq/L (ref 135–145)
Total Protein: 6.5 g/dL (ref 6.0–8.3)

## 2013-10-11 LAB — LIPID PANEL
CHOL/HDL RATIO: 2.8 ratio
CHOLESTEROL: 183 mg/dL (ref 0–200)
HDL: 65 mg/dL (ref >39)
LDL Cholesterol: 90 mg/dL (ref 0–99)
TRIGLYCERIDES: 138 mg/dL (ref <150)
VLDL: 28 mg/dL (ref 0–40)

## 2013-10-11 LAB — CBC
HCT: 37.4 % (ref 36.0–46.0)
HEMOGLOBIN: 13 g/dL (ref 12.0–15.0)
MCH: 30 pg (ref 26.0–34.0)
MCHC: 34.8 g/dL (ref 30.0–36.0)
MCV: 86.2 fL (ref 78.0–100.0)
Platelets: 336 10*3/uL (ref 150–400)
RBC: 4.34 MIL/uL (ref 3.87–5.11)
RDW: 13.2 % (ref 11.5–15.5)
WBC: 7.8 10*3/uL (ref 4.0–10.5)

## 2013-10-13 LAB — QUANTIFERON TB GOLD ASSAY (BLOOD)
Interferon Gamma Release Assay: NEGATIVE
Mitogen value: 9.7 IU/mL
Quantiferon Nil Value: 0.01 IU/mL
Quantiferon Tb Ag Minus Nil Value: 0.01 IU/mL
TB Ag value: 0.02 IU/mL

## 2014-10-16 ENCOUNTER — Encounter: Payer: Self-pay | Admitting: Internal Medicine

## 2014-10-16 ENCOUNTER — Other Ambulatory Visit (HOSPITAL_COMMUNITY)
Admission: RE | Admit: 2014-10-16 | Discharge: 2014-10-16 | Disposition: A | Discharge status: Home / Self Care | Payer: Managed Care, Other (non HMO) | Source: Ambulatory Visit | Attending: Internal Medicine | Admitting: Internal Medicine

## 2014-10-16 ENCOUNTER — Ambulatory Visit (INDEPENDENT_AMBULATORY_CARE_PROVIDER_SITE_OTHER): Payer: Managed Care, Other (non HMO) | Admitting: Internal Medicine

## 2014-10-16 VITALS — BP 112/62 | HR 75 | Temp 98.3°F | Ht 67.0 in | Wt 137.5 lb

## 2014-10-16 DIAGNOSIS — Z124 Encounter for screening for malignant neoplasm of cervix: Secondary | ICD-10-CM

## 2014-10-16 DIAGNOSIS — Z0000 Encounter for general adult medical examination without abnormal findings: Secondary | ICD-10-CM

## 2014-10-16 DIAGNOSIS — Z01419 Encounter for gynecological examination (general) (routine) without abnormal findings: Secondary | ICD-10-CM | POA: Diagnosis present

## 2014-10-16 DIAGNOSIS — R221 Localized swelling, mass and lump, neck: Secondary | ICD-10-CM | POA: Diagnosis not present

## 2014-10-16 DIAGNOSIS — Z1151 Encounter for screening for human papillomavirus (HPV): Secondary | ICD-10-CM | POA: Diagnosis present

## 2014-10-16 LAB — COMPREHENSIVE METABOLIC PANEL
ALBUMIN: 4.3 g/dL (ref 3.5–5.2)
ALK PHOS: 38 U/L — AB (ref 39–117)
ALT: <8 U/L (ref 0–35)
AST: 16 U/L (ref 0–37)
BILIRUBIN TOTAL: 0.4 mg/dL (ref 0.2–1.2)
BUN: 13 mg/dL (ref 6–23)
CHLORIDE: 104 meq/L (ref 96–112)
CO2: 23 mEq/L (ref 19–32)
CREATININE: 1.01 mg/dL (ref 0.50–1.10)
Calcium: 9.6 mg/dL (ref 8.4–10.5)
GLUCOSE: 79 mg/dL (ref 70–99)
POTASSIUM: 4.6 meq/L (ref 3.5–5.3)
Sodium: 138 mEq/L (ref 135–145)
Total Protein: 7.1 g/dL (ref 6.0–8.3)

## 2014-10-16 LAB — CBC
HEMATOCRIT: 37.2 % (ref 36.0–46.0)
Hemoglobin: 12.6 g/dL (ref 12.0–15.0)
MCH: 29.9 pg (ref 26.0–34.0)
MCHC: 33.9 g/dL (ref 30.0–36.0)
MCV: 88.2 fL (ref 78.0–100.0)
MPV: 9.8 fL (ref 8.6–12.4)
Platelets: 324 10*3/uL (ref 150–400)
RBC: 4.22 MIL/uL (ref 3.87–5.11)
RDW: 13.5 % (ref 11.5–15.5)
WBC: 9.5 10*3/uL (ref 4.0–10.5)

## 2014-10-16 LAB — LIPID PANEL
CHOL/HDL RATIO: 2.5 ratio
CHOLESTEROL: 188 mg/dL (ref 0–200)
HDL: 75 mg/dL (ref >=46)
LDL Cholesterol: 100 mg/dL — ABNORMAL HIGH (ref 0–99)
TRIGLYCERIDES: 65 mg/dL (ref <150)
VLDL: 13 mg/dL (ref 0–40)

## 2014-10-16 NOTE — Patient Instructions (Signed)

## 2014-10-16 NOTE — Progress Notes (Signed)
Subjective:    Patient ID: Margaret Frederick, female    DOB: Apr 12, 1980, 34 y.o.   MRN: [id number]  HPI  Pt presents to the clinic today for her annual exam.  Flu: yearly Tetanus: 2009 Pap smear: 2013 Dentist: biannually  Diet: She eats a balanced diet. She loves fruits and veggies Exercise: She walks most days of the week  She is concerned about a sensation of a lump in her throat. This has been going on for a few months. It is constant. Nothing seems to make it better or worse. She denies sore throat or difficulty swallowing. She has not tried anything OTC. She is concerned that it may be related to her thyroid but she is not sure.  Review of Systems      Past Medical History  Diagnosis Date  . Thyroid disease     Current Outpatient Prescriptions  Medication Sig Dispense Refill  . levothyroxine (SYNTHROID, LEVOTHROID) 75 MCG tablet Take 75 mcg by mouth daily before breakfast.     No current facility-administered medications for this visit.    No Known Allergies  No family history on file.  History   Social History  . Marital Status: Married    Spouse Name: N/A  . Number of Children: 3  . Years of Education: N/A   Occupational History  . Not on file.   Social History Main Topics  . Smoking status: Never Smoker   . Smokeless tobacco: Not on file  . Alcohol Use: No  . Drug Use: Not on file  . Sexual Activity: Not on file   Other Topics Concern  . Not on file   Social History Narrative     Constitutional: Denies fever, malaise, fatigue, headache or abrupt weight changes.  HEENT: Denies eye pain, eye redness, ear pain, ringing in the ears, wax buildup, runny nose, nasal congestion, bloody nose, or sore throat. Respiratory: Denies difficulty breathing, shortness of breath, cough or sputum production.   Cardiovascular: Denies chest pain, chest tightness, palpitations or swelling in the hands or feet.  Gastrointestinal: Denies abdominal pain, bloating,  constipation, diarrhea or blood in the stool.  GU: Denies urgency, frequency, pain with urination, burning sensation, blood in urine, odor or discharge. Musculoskeletal: Denies decrease in range of motion, difficulty with gait, muscle pain or joint pain and swelling.  Skin: Denies redness, rashes, lesions or ulcercations.  Neurological: Denies dizziness, difficulty with memory, difficulty with speech or problems with balance and coordination.  Psych: Denies anxiety, depression, SI/HI.  No other specific complaints in a complete review of systems (except as listed in HPI above).  Objective:   Physical Exam   BP 112/62 mmHg  Pulse 75  Temp(Src) 98.3 F (36.8 C) (Oral)  Ht 5\' 7"  (1.702 m)  Wt 137 lb 8 oz (62.37 kg)  BMI 21.53 kg/m2  SpO2 96%  LMP 09/25/2014 (Within Weeks) Wt Readings from Last 3 Encounters:  10/16/14 137 lb 8 oz (62.37 kg)  10/10/13 136 lb (61.689 kg)  06/25/13 132 lb (59.875 kg)    General: Appears her stated age, well developed, well nourished in NAD. Skin: Warm, dry and intact. No rashes, lesions or ulcerations noted. HEENT: Head: normal shape and size; Eyes: sclera white, no icterus, conjunctiva pink, PERRLA and EOMs intact; Ears: Tm's gray and intact, normal light reflex;  Throat/Mouth: Teeth present, mucosa pink and moist, no exudate, lesions or ulcerations noted.  Neck: Neck supple, trachea midline. No masses, lumps or thyromegaly present.  Cardiovascular: Normal rate  and rhythm. S1,S2 noted.  No murmur, rubs or gallops noted. No JVD or BLE edema. No carotid bruits noted. Pulmonary/Chest: Normal effort and positive vesicular breath sounds. No respiratory distress. No wheezes, rales or ronchi noted.  Abdomen: Soft and nontender. Normal bowel sounds, no bruits noted. No distention or masses noted. Liver, spleen and kidneys non palpable. Pelvic: Normal female anatomy. No discharge noted. Cervix friable. No CMET. No discharge noted. Musculoskeletal: Normal range  of motion. Strength 5/5 BUE/BLE. No difficulty with gait.  Neurological: Alert and oriented. Cranial nerves II-XII grossly intact. Coordination normal.  Psychiatric: Mood and affect normal. Behavior is normal. Judgment and thought content normal.   EKG:  BMET    Component Value Date/Time   NA 137 10/10/2013 1505   K 4.1 10/10/2013 1505   CL 102 10/10/2013 1505   CO2 28 10/10/2013 1505   GLUCOSE 77 10/10/2013 1505   BUN 13 10/10/2013 1505   CREATININE 0.79 10/10/2013 1505   CREATININE 0.9 10/11/2009 1709   CALCIUM 9.0 10/10/2013 1505   GFRNONAA 76.62 10/11/2009 1709    Lipid Panel     Component Value Date/Time   CHOL 183 10/10/2013 1505   TRIG 138 10/10/2013 1505   HDL 65 10/10/2013 1505   CHOLHDL 2.8 10/10/2013 1505   VLDL 28 10/10/2013 1505   LDLCALC 90 10/10/2013 1505    CBC    Component Value Date/Time   WBC 7.8 10/10/2013 1505   RBC 4.34 10/10/2013 1505   HGB 13.0 10/10/2013 1505   HCT 37.4 10/10/2013 1505   PLT 336 10/10/2013 1505   MCV 86.2 10/10/2013 1505   MCH 30.0 10/10/2013 1505   MCHC 34.8 10/10/2013 1505   RDW 13.2 10/10/2013 1505   LYMPHSABS 1.9 10/11/2009 1709   MONOABS 0.5 10/11/2009 1709   EOSABS 0.2 10/11/2009 1709   BASOSABS 0.0 10/11/2009 1709    Hgb A1C No results found for: HGBA1C      Assessment & Plan:   Preventative Health Maintenance:  Advised her to get a flu shot in the fall Tetanus UTD Pap smear today Encouraged her to see a dentist at least once per year CBC, CMET and Lipid profile today   Lump in throat:  She just had TSH checked by endo, normal by her report Will obtain thyroid ultrasound If normal, consider referral to GI  RTC in 1 year or sooner if needed

## 2014-10-16 NOTE — Progress Notes (Signed)
Pre visit review using our clinic review tool, if applicable. No additional management support is needed unless otherwise documented below in the visit note. 

## 2014-10-16 NOTE — Addendum Note (Signed)
Addended by: Lorre Munroe on: 10/16/2014 07:53 PM   Modules accepted: Kipp Brood

## 2014-10-19 LAB — CYTOLOGY - PAP

## 2014-10-23 ENCOUNTER — Ambulatory Visit
Admission: RE | Admit: 2014-10-23 | Discharge: 2014-10-23 | Disposition: A | Discharge status: Home / Self Care | Payer: Managed Care, Other (non HMO) | Source: Ambulatory Visit | Attending: Internal Medicine | Admitting: Internal Medicine

## 2014-10-23 DIAGNOSIS — R599 Enlarged lymph nodes, unspecified: Secondary | ICD-10-CM | POA: Diagnosis not present

## 2014-10-23 DIAGNOSIS — R221 Localized swelling, mass and lump, neck: Secondary | ICD-10-CM | POA: Diagnosis present

## 2015-01-15 ENCOUNTER — Encounter: Payer: Self-pay | Admitting: Primary Care

## 2015-01-15 ENCOUNTER — Ambulatory Visit (INDEPENDENT_AMBULATORY_CARE_PROVIDER_SITE_OTHER): Payer: Managed Care, Other (non HMO) | Admitting: Primary Care

## 2015-01-15 VITALS — BP 116/70 | HR 101 | Temp 98.9°F | Ht 67.0 in | Wt 138.8 lb

## 2015-01-15 DIAGNOSIS — J029 Acute pharyngitis, unspecified: Secondary | ICD-10-CM

## 2015-01-15 MED ORDER — AMOXICILLIN 500 MG PO CAPS: 500.0000 mg | ORAL_CAPSULE | Freq: Two times a day (BID) | ORAL | Status: DC

## 2015-01-15 MED ORDER — LIDOCAINE VISCOUS 2 % MT SOLN: OROMUCOSAL | Status: DC

## 2015-01-15 NOTE — Progress Notes (Signed)
Pre visit review using our clinic review tool, if applicable. No additional management support is needed unless otherwise documented below in the visit note. 

## 2015-01-15 NOTE — Patient Instructions (Signed)
Start Amoxicillin antibiotics. Take 1 tablet by mouth twice daily for 5 days.  You may use oral lidocaine as needed for sore throat. Swish and swallow 10 ml every 8 hours as needed.  Ensure your are staying hydrated with water.  Follow up if no improvement in 3-4 days.  It was a pleasure meeting you!

## 2015-01-15 NOTE — Progress Notes (Signed)
   Subjective:    Patient ID: Margaret Frederick, female    DOB: 10/19/1980, 34 y.o.   MRN: [id number]  HPI  [name] is a 34 year old female who presents today with a chief complaint of sore throat. She also reports body aches and chills. Her symptoms have been present for 3 days. She's tried taking ibuprofen for sore throat with mild improvement. Overall her sore throat has become worse. Denies fevers, nausea, cough.  Review of Systems  Constitutional: Positive for chills. Negative for fever.  HENT: Positive for sore throat. Negative for congestion, ear pain, postnasal drip and rhinorrhea.   Respiratory: Negative for cough and shortness of breath.   Cardiovascular: Negative for chest pain.  Gastrointestinal: Positive for nausea. Negative for vomiting.  Musculoskeletal: Positive for myalgias.       Past Medical History  Diagnosis Date  . Thyroid disease     Social History   Social History  . Marital Status: Married    Spouse Name: N/A  . Number of Children: 3  . Years of Education: N/A   Occupational History  . Not on file.   Social History Main Topics  . Smoking status: Never Smoker   . Smokeless tobacco: Not on file  . Alcohol Use: No  . Drug Use: Not on file  . Sexual Activity: Not on file   Other Topics Concern  . Not on file   Social History Narrative    Past Surgical History  Procedure Laterality Date  . Laparoscopic appendectomy  8/12  . Cesarean section      x 3    No family history on file.  No Known Allergies  Current Outpatient Prescriptions on File Prior to Visit  Medication Sig Dispense Refill  . levothyroxine (SYNTHROID, LEVOTHROID) 75 MCG tablet Take 75 mcg by mouth daily before breakfast.     No current facility-administered medications on file prior to visit.    BP 116/70 mmHg  Pulse 101  Temp(Src) 98.9 F (37.2 C) (Oral)  Ht 5\' 7"  (1.702 m)  Wt 138 lb 12.8 oz (62.959 kg)  BMI 21.73 kg/m2  SpO2 97%  LMP  01/01/2015    Objective:   Physical Exam  Constitutional: She appears well-nourished.  HENT:  Right Ear: Tympanic membrane and ear canal normal.  Left Ear: Tympanic membrane and ear canal normal.  Nose: Nose normal. Right sinus exhibits no maxillary sinus tenderness and no frontal sinus tenderness. Left sinus exhibits no maxillary sinus tenderness and no frontal sinus tenderness.  Mouth/Throat: Oropharyngeal exudate, posterior oropharyngeal edema and posterior oropharyngeal erythema present.  Cardiovascular: Normal rate and regular rhythm.   Pulmonary/Chest: Effort normal and breath sounds normal.  Skin: Skin is warm and dry.          Assessment & Plan:  Sore throat:  Sore throat x 3 days, worse in last 24 hours. Exam with erythema, edema, and exudate. Rapid strep:Negative. Due to presentation and exam will treat empirically. Start amoxicillin BID x 7 days. Rx for oral lidocaine to use PRN for pain. Push fluids, rest. Follow up if no improvement in 3-4 days.

## 2015-08-31 ENCOUNTER — Encounter: Payer: Self-pay | Admitting: Internal Medicine

## 2015-08-31 ENCOUNTER — Ambulatory Visit (INDEPENDENT_AMBULATORY_CARE_PROVIDER_SITE_OTHER): Payer: Managed Care, Other (non HMO) | Admitting: Internal Medicine

## 2015-08-31 VITALS — BP 116/74 | HR 70 | Temp 97.9°F | Wt 135.8 lb

## 2015-08-31 DIAGNOSIS — E038 Other specified hypothyroidism: Secondary | ICD-10-CM

## 2015-08-31 LAB — T4, FREE: FREE T4: 0.95 ng/dL (ref 0.60–1.60)

## 2015-08-31 LAB — TSH: TSH: 2.51 u[IU]/mL (ref 0.35–4.50)

## 2015-08-31 NOTE — Assessment & Plan Note (Signed)
Will check TSH and T4 today Will refill Synthroid based on labs

## 2015-08-31 NOTE — Progress Notes (Signed)
Pre visit review using our clinic review tool, if applicable. No additional management support is needed unless otherwise documented below in the visit note. 

## 2015-08-31 NOTE — Progress Notes (Signed)
Subjective:    Patient ID: Margaret CabotMichelle Frederick, female    DOB: 09/28/1980, 35 y.o.   MRN: [id number]  HPI  Pt presents to the clinic today to follow up hypothyroidism. She is taking Synthroid as prescribed. She denies weight gain, cold intolerance, constipation, dry skin or depression. She had a thyroid ultrasound 09/2014 which showed:  IMPRESSION: Mildly heterogeneous atrophic appearing thyroid gland. Benign-appearing cervical lymph nodes.  Review of Systems      Past Medical History  Diagnosis Date  . Thyroid disease     Current Outpatient Prescriptions  Medication Sig Dispense Refill  . levothyroxine (SYNTHROID, LEVOTHROID) 75 MCG tablet Take 75 mcg by mouth daily before breakfast.     No current facility-administered medications for this visit.    No Known Allergies  No family history on file.  Social History   Social History  . Marital Status: Married    Spouse Name: N/A  . Number of Children: 3  . Years of Education: N/A   Occupational History  . Not on file.   Social History Main Topics  . Smoking status: Never Smoker   . Smokeless tobacco: Not on file  . Alcohol Use: No  . Drug Use: Not on file  . Sexual Activity: Not on file   Other Topics Concern  . Not on file   Social History Narrative     Constitutional: Denies fever, malaise, fatigue, headache or abrupt weight changes.  Respiratory: Denies difficulty breathing, shortness of breath, cough or sputum production.   Cardiovascular: Denies chest pain, chest tightness, palpitations or swelling in the hands or feet.  Gastrointestinal: Denies abdominal pain, bloating, constipation, diarrhea or blood in the stool.  Skin: Denies redness, rashes, lesions or ulcercations.  Neurological: Denies dizziness, difficulty with memory, difficulty with speech or problems with balance and coordination.  Psych: Denies anxiety, depression, SI/HI.  No other specific complaints in a complete review of systems (except  as listed in HPI above).  Objective:   Physical Exam   BP 116/74 mmHg  Pulse 70  Temp(Src) 97.9 F (36.6 C) (Oral)  Wt 135 lb 12 oz (61.576 kg)  SpO2 99%  LMP 08/18/2015 Wt Readings from Last 3 Encounters:  08/31/15 135 lb 12 oz (61.576 kg)  01/15/15 138 lb 12.8 oz (62.959 kg)  10/16/14 137 lb 8 oz (62.37 kg)    General: Appears her stated age, well developed, well nourished in NAD. Skin: Warm, dry and intact. No rashes, lesions or ulcerations noted. Neck:  Neck supple, trachea midline. No masses, lumps or thyromegaly present.  Cardiovascular: Normal rate and rhythm. S1,S2 noted.  No murmur, rubs or gallops noted. Pulmonary/Chest: Normal effort and positive vesicular breath sounds. No respiratory distress. No wheezes, rales or ronchi noted.  Neurological: Alert and oriented.  Psychiatric: Mood and affect normal. Behavior is normal. Judgment and thought content normal.     BMET    Component Value Date/Time   NA 138 10/16/2014 1521   K 4.6 10/16/2014 1521   CL 104 10/16/2014 1521   CO2 23 10/16/2014 1521   GLUCOSE 79 10/16/2014 1521   BUN 13 10/16/2014 1521   CREATININE 1.01 10/16/2014 1521   CREATININE 0.9 10/11/2009 1709   CALCIUM 9.6 10/16/2014 1521   GFRNONAA 76.62 10/11/2009 1709    Lipid Panel     Component Value Date/Time   CHOL 188 10/16/2014 1521   TRIG 65 10/16/2014 1521   HDL 75 10/16/2014 1521   CHOLHDL 2.5 10/16/2014 1521   VLDL  13 10/16/2014 1521   LDLCALC 100* 10/16/2014 1521    CBC    Component Value Date/Time   WBC 9.5 10/16/2014 1521   RBC 4.22 10/16/2014 1521   HGB 12.6 10/16/2014 1521   HCT 37.2 10/16/2014 1521   PLT 324 10/16/2014 1521   MCV 88.2 10/16/2014 1521   MCH 29.9 10/16/2014 1521   MCHC 33.9 10/16/2014 1521   RDW 13.5 10/16/2014 1521   LYMPHSABS 1.9 10/11/2009 1709   MONOABS 0.5 10/11/2009 1709   EOSABS 0.2 10/11/2009 1709   BASOSABS 0.0 10/11/2009 1709    Hgb A1C No results found for: HGBA1C      Assessment &  Plan:

## 2015-08-31 NOTE — Patient Instructions (Signed)

## 2015-09-03 ENCOUNTER — Telehealth: Payer: Self-pay | Admitting: Internal Medicine

## 2015-09-03 ENCOUNTER — Other Ambulatory Visit: Payer: Self-pay | Admitting: Internal Medicine

## 2015-09-03 MED ORDER — LEVOTHYROXINE SODIUM 75 MCG PO TABS: 75.0000 ug | ORAL_TABLET | Freq: Every day | ORAL | Status: DC

## 2015-09-03 NOTE — Telephone Encounter (Signed)
Patient advised.

## 2015-09-03 NOTE — Telephone Encounter (Signed)
Pt said she never received call with thyroid labs results. Pt needs a medication on levothyroxine refill? Please advise. Pt is out of medication   [facility]- [address]

## 2015-09-03 NOTE — Telephone Encounter (Signed)
Melanie mailed her a Physicist, medicalletter. Labs were normal. Synthroid refilled

## 2015-10-22 ENCOUNTER — Encounter: Payer: Managed Care, Other (non HMO) | Admitting: Internal Medicine

## 2016-09-04 ENCOUNTER — Ambulatory Visit (INDEPENDENT_AMBULATORY_CARE_PROVIDER_SITE_OTHER): Payer: Managed Care, Other (non HMO) | Admitting: Internal Medicine

## 2016-09-04 ENCOUNTER — Encounter: Payer: Self-pay | Admitting: Internal Medicine

## 2016-09-04 VITALS — BP 124/60 | HR 77 | Temp 97.6°F | Ht 67.0 in | Wt 161.5 lb

## 2016-09-04 DIAGNOSIS — J4520 Mild intermittent asthma, uncomplicated: Secondary | ICD-10-CM

## 2016-09-04 DIAGNOSIS — E039 Hypothyroidism, unspecified: Secondary | ICD-10-CM

## 2016-09-04 DIAGNOSIS — Z0000 Encounter for general adult medical examination without abnormal findings: Secondary | ICD-10-CM | POA: Diagnosis not present

## 2016-09-04 LAB — CBC
HCT: 41.1 % (ref 36.0–46.0)
Hemoglobin: 13.8 g/dL (ref 12.0–15.0)
MCHC: 33.5 g/dL (ref 30.0–36.0)
MCV: 85.8 fl (ref 78.0–100.0)
Platelets: 343 10*3/uL (ref 150.0–400.0)
RBC: 4.8 Mil/uL (ref 3.87–5.11)
RDW: 15.5 % (ref 11.5–15.5)
WBC: 6.9 10*3/uL (ref 4.0–10.5)

## 2016-09-04 LAB — COMPREHENSIVE METABOLIC PANEL
ALBUMIN: 4.5 g/dL (ref 3.5–5.2)
ALT: 36 U/L — AB (ref 0–35)
AST: 27 U/L (ref 0–37)
Alkaline Phosphatase: 55 U/L (ref 39–117)
BILIRUBIN TOTAL: 0.3 mg/dL (ref 0.2–1.2)
BUN: 17 mg/dL (ref 6–23)
CALCIUM: 10 mg/dL (ref 8.4–10.5)
CO2: 28 mEq/L (ref 19–32)
CREATININE: 1 mg/dL (ref 0.40–1.20)
Chloride: 103 mEq/L (ref 96–112)
GFR: 65.98 mL/min (ref >60.00)
Glucose, Bld: 87 mg/dL (ref 70–99)
Potassium: 4.3 mEq/L (ref 3.5–5.1)
Sodium: 138 mEq/L (ref 135–145)
Total Protein: 7.4 g/dL (ref 6.0–8.3)

## 2016-09-04 LAB — LIPID PANEL
Cholesterol: 210 mg/dL — ABNORMAL HIGH (ref 0–200)
HDL: 64.8 mg/dL (ref >39.00)
LDL Cholesterol: 122 mg/dL — ABNORMAL HIGH (ref 0–99)
NONHDL: 144.79
Total CHOL/HDL Ratio: 3
Triglycerides: 116 mg/dL (ref 0.0–149.0)
VLDL: 23.2 mg/dL (ref 0.0–40.0)

## 2016-09-04 LAB — T4, FREE: FREE T4: 0.89 ng/dL (ref 0.60–1.60)

## 2016-09-04 LAB — TSH: TSH: 0.96 u[IU]/mL (ref 0.35–4.50)

## 2016-09-04 NOTE — Assessment & Plan Note (Signed)
TSH and T4 today Will adjust Synthroid if needed based on labs She does need refills, will send in once labs are back

## 2016-09-04 NOTE — Progress Notes (Signed)
Subjective:    Patient ID: Margaret Frederick, female    DOB: Feb 25, 1981, 36 y.o.   MRN: [id number]  HPI  Pt presents to the clinic today for her annual exam. She is also due to follow up on chronic conditions.  Hypothyroidism: Her levels from 08/2015 were normal. She has not had them checked in the postpartum period.   Asthma: Mild, intermittent. She does not use any inhalers.  Flu: yearly Tetanus: 04/2011 Pap Smear: 09/2014 Dentist: biannually  Diet: She does eat meat. She consumes fruits and veggies daily. She does occasionally eat fried foods. She drinks mostly water. Exercise: None  Review of Systems      Past Medical History:  Diagnosis Date  . Thyroid disease     Current Outpatient Prescriptions  Medication Sig Dispense Refill  . levothyroxine (SYNTHROID, LEVOTHROID) 75 MCG tablet Take 1 tablet (75 mcg total) by mouth daily before breakfast. 30 tablet 11   No current facility-administered medications for this visit.     No Known Allergies  No family history on file.  Social History   Social History  . Marital status: Married    Spouse name: N/A  . Number of children: 3  . Years of education: N/A   Occupational History  . Not on file.   Social History Main Topics  . Smoking status: Never Smoker  . Smokeless tobacco: Not on file  . Alcohol use No  . Drug use: Unknown  . Sexual activity: Not on file   Other Topics Concern  . Not on file   Social History Narrative  . No narrative on file     Constitutional: Denies fever, malaise, fatigue, headache or abrupt weight changes.  HEENT: Denies eye pain, eye redness, ear pain, ringing in the ears, wax buildup, runny nose, nasal congestion, bloody nose, or sore throat. Respiratory: Denies difficulty breathing, shortness of breath, cough or sputum production.   Cardiovascular: Denies chest pain, chest tightness, palpitations or swelling in the hands or feet.  Gastrointestinal: Denies abdominal pain,  bloating, constipation, diarrhea or blood in the stool.  GU: Denies urgency, frequency, pain with urination, burning sensation, blood in urine, odor or discharge. Musculoskeletal: Denies decrease in range of motion, difficulty with gait, muscle pain or joint pain and swelling.  Skin: Denies redness, rashes, lesions or ulcercations.  Neurological: Denies dizziness, difficulty with memory, difficulty with speech or problems with balance and coordination.  Psych: Denies anxiety, depression, SI/HI.  No other specific complaints in a complete review of systems (except as listed in HPI above).  Objective:   Physical Exam   BP 124/60   Pulse 77   Temp 97.6 F (36.4 C) (Oral)   Ht 5\' 7"  (1.702 m)   Wt 161 lb 8 oz (73.3 kg)   SpO2 97%   BMI 25.29 kg/m  Wt Readings from Last 3 Encounters:  09/04/16 161 lb 8 oz (73.3 kg)  08/31/15 135 lb 12 oz (61.6 kg)  01/15/15 138 lb 12.8 oz (63 kg)    General: Appears her stated age, well developed, well nourished in NAD. Skin: Warm, dry and intact. No rashes, lesions or ulcerations noted. HEENT: Head: normal shape and size; Eyes: sclera white, no icterus, conjunctiva pink, PERRLA and EOMs intact; Ears: Tm's gray and intact, normal light reflex; Throat/Mouth: Teeth present, mucosa pink and moist, no exudate, lesions or ulcerations noted.  Neck:  Neck supple, trachea midline. No masses, lumps or thyromegaly present.  Cardiovascular: Normal rate and rhythm. S1,S2 noted.  No murmur, rubs or gallops noted. No JVD or BLE edema.  Pulmonary/Chest: Normal effort and positive vesicular breath sounds. No respiratory distress. No wheezes, rales or ronchi noted.  Abdomen: Soft and nontender. Normal bowel sounds. No distention or masses noted. Liver, spleen and kidneys non palpable. Musculoskeletal: Strength 5/5 BUE/BLE. No difficulty with gait.  Neurological: Alert and oriented. Cranial nerves II-XII grossly intact. Coordination normal.  Psychiatric: Mood and  affect normal. Behavior is normal. Judgment and thought content normal.     BMET    Component Value Date/Time   NA 138 10/16/2014 1521   K 4.6 10/16/2014 1521   CL 104 10/16/2014 1521   CO2 23 10/16/2014 1521   GLUCOSE 79 10/16/2014 1521   BUN 13 10/16/2014 1521   CREATININE 1.01 10/16/2014 1521   CALCIUM 9.6 10/16/2014 1521   GFRNONAA 76.62 10/11/2009 1709    Lipid Panel     Component Value Date/Time   CHOL 188 10/16/2014 1521   TRIG 65 10/16/2014 1521   HDL 75 10/16/2014 1521   CHOLHDL 2.5 10/16/2014 1521   VLDL 13 10/16/2014 1521   LDLCALC 100 (H) 10/16/2014 1521    CBC    Component Value Date/Time   WBC 9.5 10/16/2014 1521   RBC 4.22 10/16/2014 1521   HGB 12.6 10/16/2014 1521   HCT 37.2 10/16/2014 1521   PLT 324 10/16/2014 1521   MCV 88.2 10/16/2014 1521   MCH 29.9 10/16/2014 1521   MCHC 33.9 10/16/2014 1521   RDW 13.5 10/16/2014 1521   LYMPHSABS 1.9 10/11/2009 1709   MONOABS 0.5 10/11/2009 1709   EOSABS 0.2 10/11/2009 1709   BASOSABS 0.0 10/11/2009 1709    Hgb A1C No results found for: HGBA1C        Assessment & Plan:   Preventative Health Maintenance:  Encouraged her to get a flu shot in the fall Tetanus UTD Pap smear UTD Encouraged her to consume a balanced diet and exercise regimen Advised her to see an eye doctor an dentist annually Will check CBC, CMET, Lipid, TSH and T4 today  RTC in1 year, sooner if needed Nicki ReaperBAITY, REGINA, NP

## 2016-09-04 NOTE — Patient Instructions (Signed)

## 2016-09-04 NOTE — Assessment & Plan Note (Signed)
Currently not an issue Will monitor 

## 2016-09-05 ENCOUNTER — Encounter: Payer: Self-pay | Admitting: *Deleted

## 2016-09-05 ENCOUNTER — Other Ambulatory Visit: Payer: Self-pay | Admitting: Internal Medicine

## 2016-09-05 MED ORDER — LEVOTHYROXINE SODIUM 75 MCG PO TABS: 75.0000 ug | ORAL_TABLET | Freq: Every day | ORAL | 11 refills | Status: DC

## 2016-12-26 ENCOUNTER — Telehealth: Payer: Self-pay | Admitting: Internal Medicine

## 2016-12-26 NOTE — Telephone Encounter (Signed)
Pt had annual exam on 09/04/16 and pt was not using inhalers at that time per office note; no inhaler on current or hx med list.Please advise.

## 2016-12-26 NOTE — Telephone Encounter (Signed)
Patient Name: Margaret Frederick DOB: 09-05-1980 Initial Comment Caller states c/o asthma attack, inhaler is expired and needs a refill. Nurse Assessment Nurse: Charna Elizabeth, RN, Cathy Date/Time (Eastern Time): 12/26/2016 1:19:23 PM Confirm and document reason for call. If symptomatic, describe symptoms. ---[name] states that she is not having severe breathing difficulty or blueness around her lips. She is out of her Albuterol and developed some Asthma symptoms this afternoon. No fever. Alert and responsive. Does the patient have any new or worsening symptoms? ---Yes Will a triage be completed? ---Yes Related visit to physician within the last 2 weeks? ---No Does the PT have any chronic conditions? (i.e. diabetes, asthma, etc.) ---Yes List chronic conditions. ---Asthma, Thyroid problems Is the patient pregnant or possibly pregnant? (Ask all females between the ages of 31-55) ---No Is this a behavioral health or substance abuse call? ---No Guidelines Guideline Title Affirmed Question Affirmed Notes Asthma Attack No asthma check-up in > 6 months Final Disposition User See PCP within 2 Glenna Fellows, RN, Lynden Ang Comments [name] needs her Albuterol refilled. She will call back after the 2pm to follow up on the refill and she will check her schedule regarding scheduling an appointment for Asthma followup since it has been more than six months since last evaluated. She will call back if her symptoms worsen or new symptoms develop. (Attempted to schedule appointment during the call.) Referrals REFERRED TO PCP OFFICE Caller Disagree/Comply Comply Caller Understands Yes PreDisposition Call Doctor

## 2016-12-27 MED ORDER — ALBUTEROL SULFATE HFA 108 (90 BASE) MCG/ACT IN AERS: 2.0000 | INHALATION_SPRAY | Freq: Four times a day (QID) | RESPIRATORY_TRACT | 0 refills | Status: AC | PRN

## 2016-12-27 NOTE — Addendum Note (Signed)
Addended by: Lorre Munroe on: 12/27/2016 11:35 AM   Modules accepted: Orders

## 2016-12-27 NOTE — Telephone Encounter (Signed)
Albuterol inhaler sent to [facility] in [place].

## 2016-12-27 NOTE — Telephone Encounter (Signed)
Pt left v/m requesting cb with status of order for inhaler.

## 2017-04-07 IMAGING — US US SOFT TISSUE HEAD/NECK
1 series · 14 of 25 positions shown · non-contrast
Comparison: None.

CLINICAL DATA: Hyperthyroidism, " Lump in throat "

EXAM:
THYROID ULTRASOUND
TECHNIQUE: Ultrasound examination of the thyroid gland and adjacent soft
tissues was performed.

[Series 1: us soft tissue head/neck · 0.08mm/px · 14 of 50 slices shown]
[im 1/50]
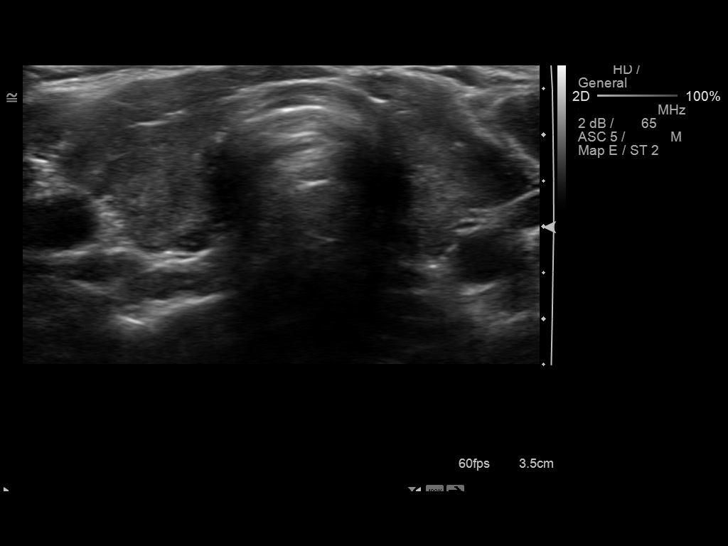
[im 5/50]
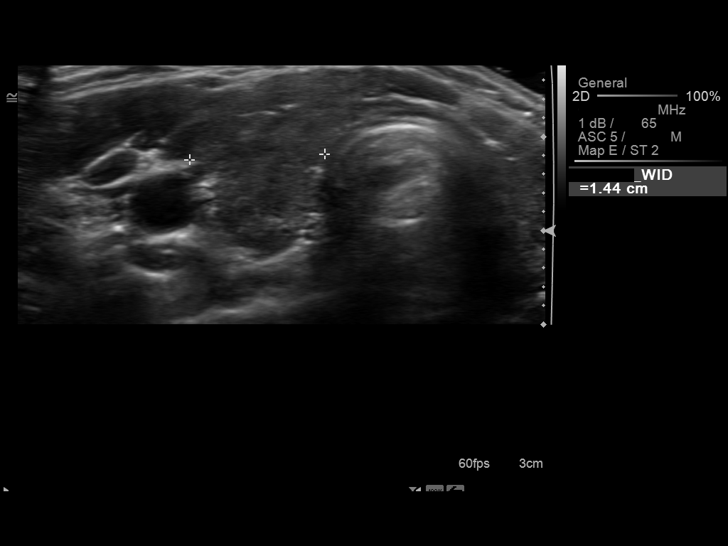
[im 9/50]
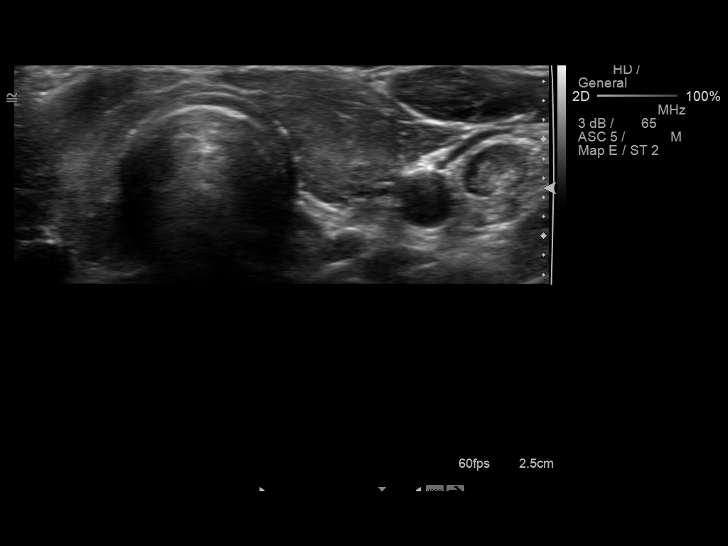
[im 13/50]
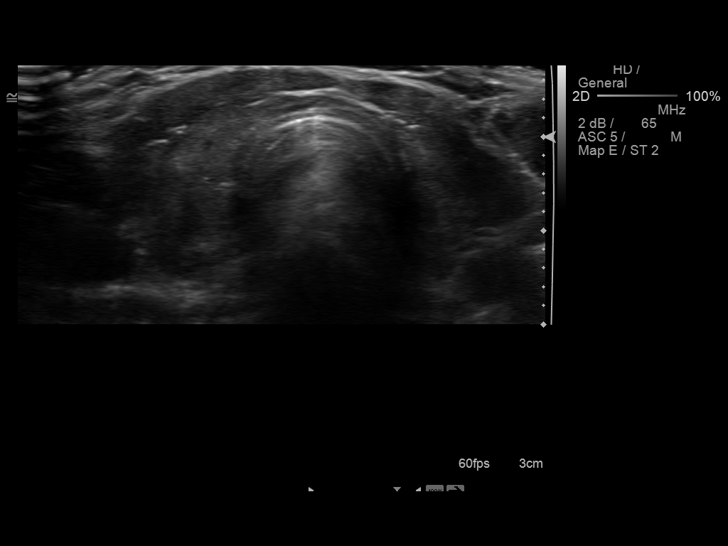
[im 17/50]
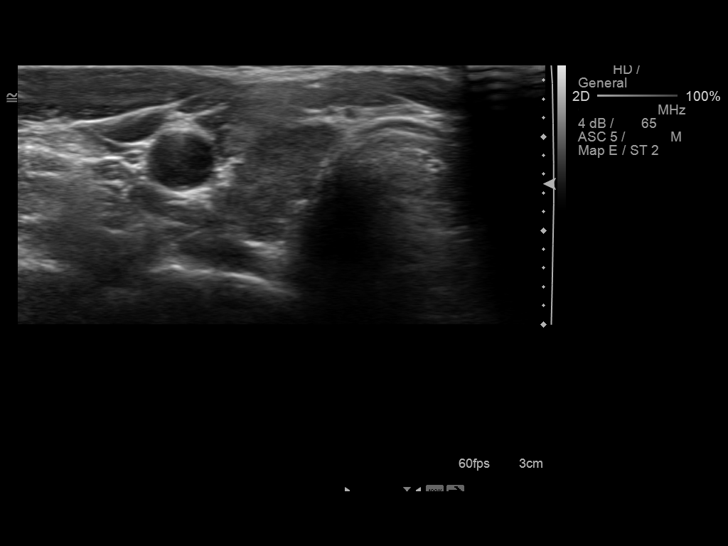
[im 19/50]
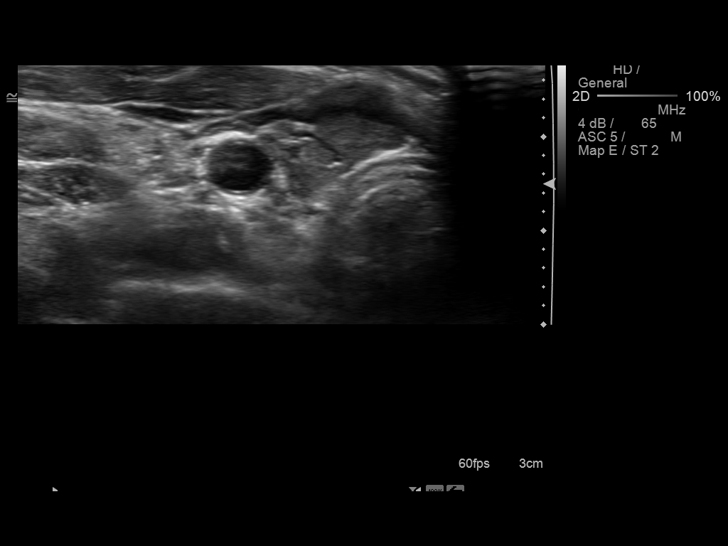
[im 23/50]
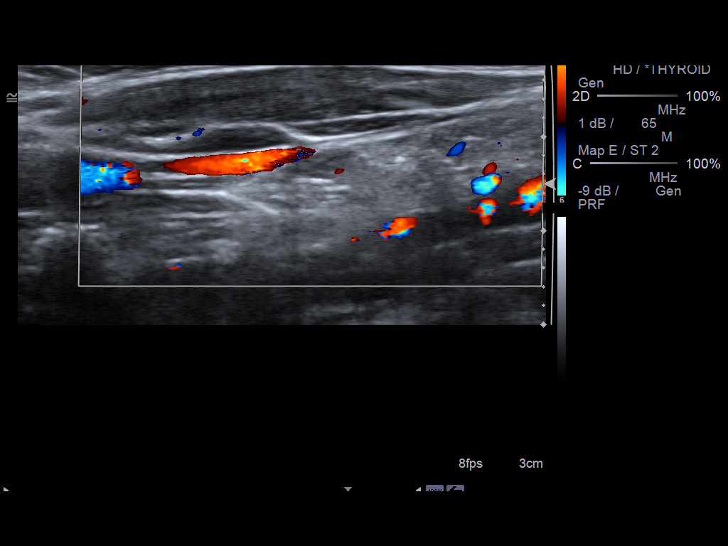
[im 27/50]
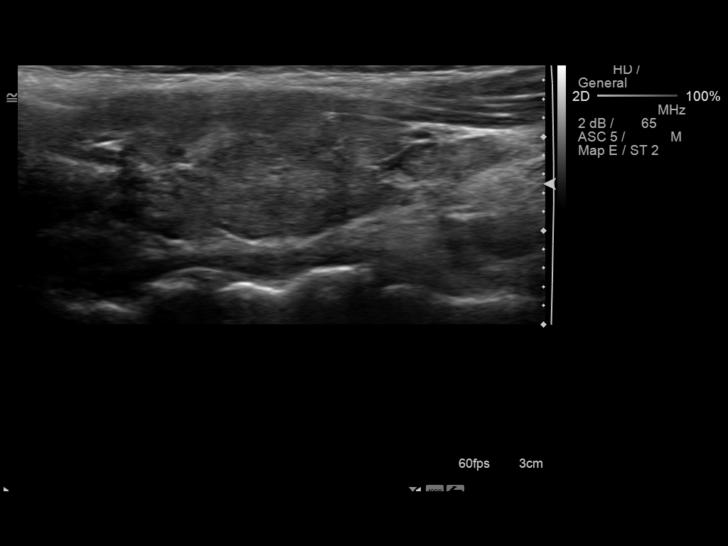
[im 31/50]
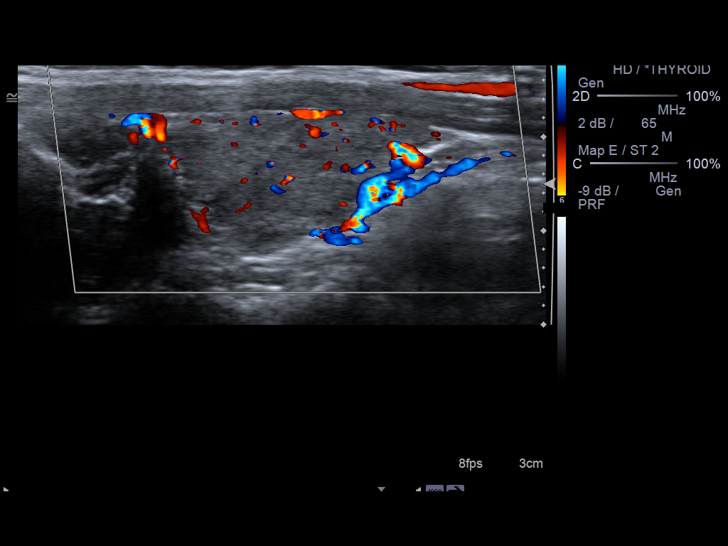
[im 33/50]
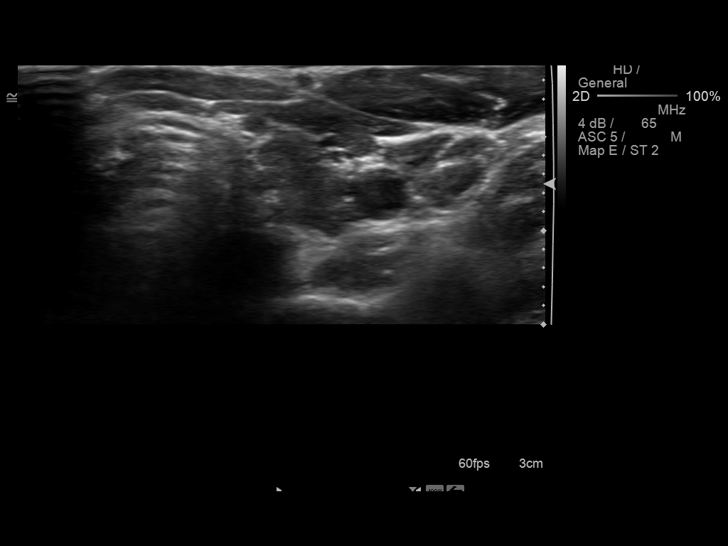
[im 37/50]
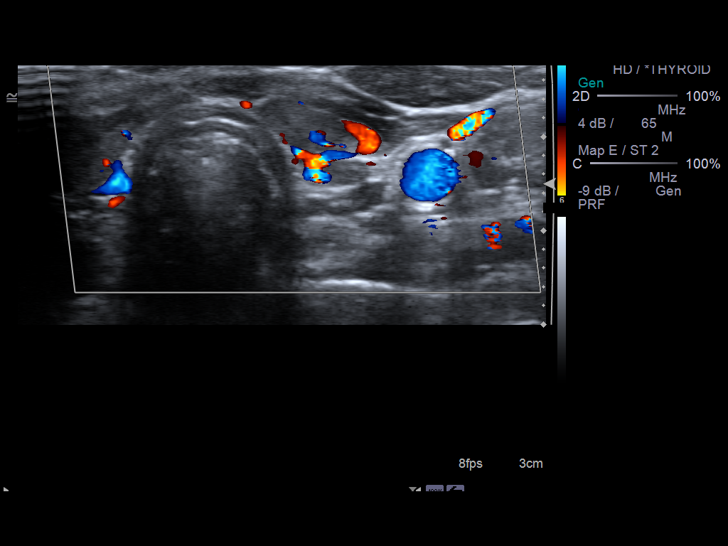
[im 41/50]
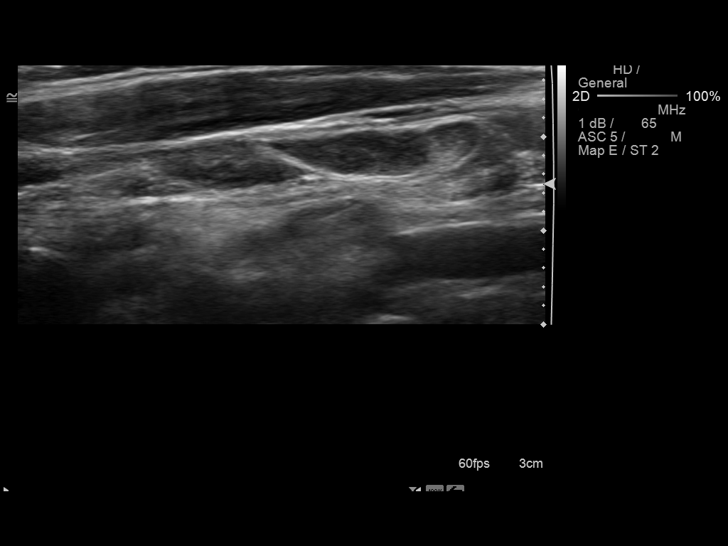
[im 45/50]
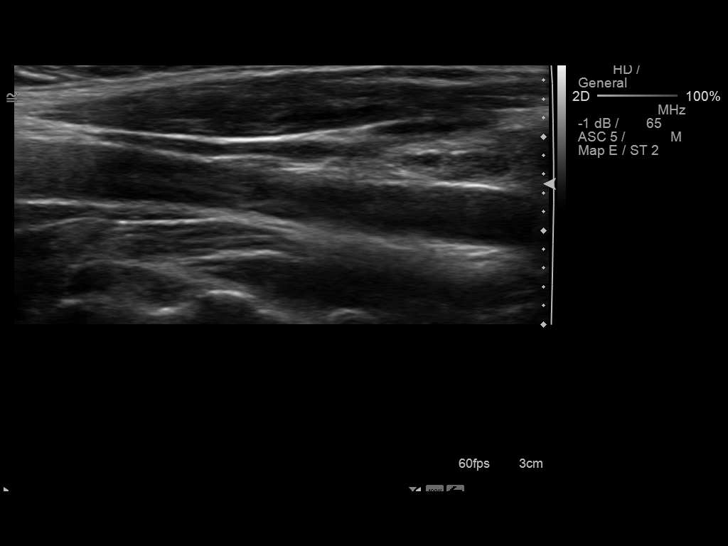
[im 50/50]
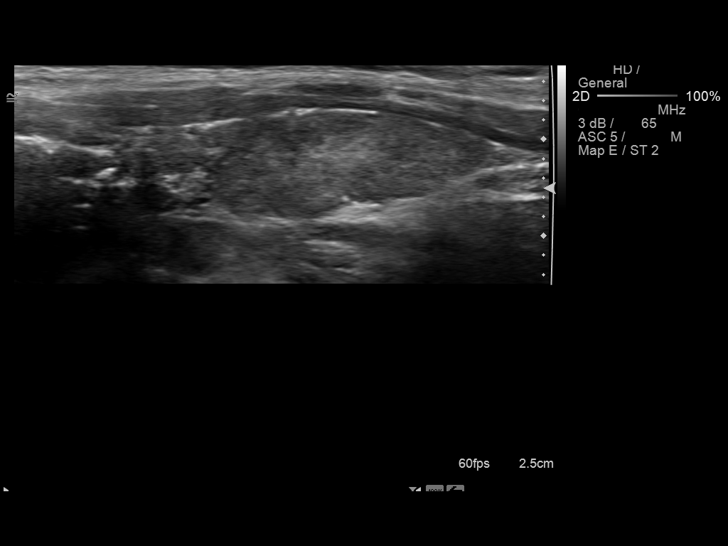

[14 of 25 positions shown; findings below may reference images not displayed]

FINDINGS: Right thyroid lobe

Measurements: 3.3 x 1.4 x 1.4 cm. Hypoechoic mildly heterogeneous
right thyroid lobe. No discrete abnormality.

Left thyroid lobe

Measurements: 3.8 x 1.1 x 1.2 cm. Similar heterogeneous left thyroid
lobe without a discrete abnormality.

Isthmus

Thickness: 1 mm.  No nodules visualized.

Lymphadenopathy

Scattered small benign-appearing cervical lymph nodes all with short
axis measurements of 5 mm or less.
IMPRESSION: Mildly heterogeneous atrophic appearing thyroid gland.

Benign-appearing cervical lymph nodes.

## 2017-06-01 ENCOUNTER — Other Ambulatory Visit: Payer: Self-pay | Admitting: Internal Medicine

## 2017-06-01 NOTE — Telephone Encounter (Signed)
Rosey Batheresa with [facility] said pt calling to ck on thyroid med change of manufacturer; pt out of med for 2 days. [facility] notified [facility] as instructed Nicki said rx will be ready for pick up in approx 1 hr. Rosey Batheresa voiced understanding and will let pt know.

## 2017-06-01 NOTE — Telephone Encounter (Signed)
Copied from CRM [id number]. Topic: Quick Communication - Rx Refill/Question >> Jun 01, 2017  8:43 AM Lelon FrohlichGolden, Tashia, RMA wrote: Medication: levothyroxine 75 mcg   Has the patient contacted their pharmacy? yes   (Agent: If no, request that the patient contact the pharmacy for the refill.)   Preferred Pharmacy (with phone number or street name): [facility] [address] in [place]  Pt stated that pharmacy had changed manufactures and that she is completely out of medication   Agent: Please be advised that RX refills may take up to 3 business days. We ask that you follow-up with your pharmacy.

## 2017-06-01 NOTE — Telephone Encounter (Signed)
Patient has been contacted by the pharmacy- they are needing to change the manufacturer for her thyroid medication and they need an approval from the office. They told her they have sent a request in already. She has been out of her medication for 2 days.

## 2017-06-01 NOTE — Telephone Encounter (Signed)
Ok to change manufacturer. 

## 2017-09-05 ENCOUNTER — Other Ambulatory Visit (HOSPITAL_COMMUNITY)
Admission: RE | Admit: 2017-09-05 | Discharge: 2017-09-05 | Disposition: A | Discharge status: Home / Self Care | Payer: BLUE CROSS/BLUE SHIELD | Source: Ambulatory Visit | Attending: Internal Medicine | Admitting: Internal Medicine

## 2017-09-05 ENCOUNTER — Ambulatory Visit (INDEPENDENT_AMBULATORY_CARE_PROVIDER_SITE_OTHER): Payer: BLUE CROSS/BLUE SHIELD | Admitting: Internal Medicine

## 2017-09-05 ENCOUNTER — Encounter: Payer: Self-pay | Admitting: Internal Medicine

## 2017-09-05 VITALS — BP 112/76 | HR 64 | Temp 97.9°F | Ht 67.0 in | Wt 151.0 lb

## 2017-09-05 DIAGNOSIS — Z113 Encounter for screening for infections with a predominantly sexual mode of transmission: Secondary | ICD-10-CM | POA: Diagnosis not present

## 2017-09-05 DIAGNOSIS — E039 Hypothyroidism, unspecified: Secondary | ICD-10-CM | POA: Diagnosis not present

## 2017-09-05 DIAGNOSIS — J4520 Mild intermittent asthma, uncomplicated: Secondary | ICD-10-CM

## 2017-09-05 DIAGNOSIS — Z124 Encounter for screening for malignant neoplasm of cervix: Secondary | ICD-10-CM | POA: Insufficient documentation

## 2017-09-05 DIAGNOSIS — Z0000 Encounter for general adult medical examination without abnormal findings: Secondary | ICD-10-CM

## 2017-09-05 LAB — CBC
HCT: 38.6 % (ref 36.0–46.0)
Hemoglobin: 13.1 g/dL (ref 12.0–15.0)
MCHC: 33.9 g/dL (ref 30.0–36.0)
MCV: 90.7 fl (ref 78.0–100.0)
PLATELETS: 351 10*3/uL (ref 150.0–400.0)
RBC: 4.25 Mil/uL (ref 3.87–5.11)
RDW: 13.6 % (ref 11.5–15.5)
WBC: 8.7 10*3/uL (ref 4.0–10.5)

## 2017-09-05 LAB — LIPID PANEL
CHOL/HDL RATIO: 3
Cholesterol: 192 mg/dL (ref 0–200)
HDL: 65.3 mg/dL (ref >39.00)
LDL CALC: 99 mg/dL (ref 0–99)
NONHDL: 127.17
Triglycerides: 143 mg/dL (ref 0.0–149.0)
VLDL: 28.6 mg/dL (ref 0.0–40.0)

## 2017-09-05 LAB — COMPREHENSIVE METABOLIC PANEL
ALT: 12 U/L (ref 0–35)
AST: 18 U/L (ref 0–37)
Albumin: 4.3 g/dL (ref 3.5–5.2)
Alkaline Phosphatase: 55 U/L (ref 39–117)
BILIRUBIN TOTAL: 0.3 mg/dL (ref 0.2–1.2)
BUN: 8 mg/dL (ref 6–23)
CALCIUM: 9.6 mg/dL (ref 8.4–10.5)
CHLORIDE: 104 meq/L (ref 96–112)
CO2: 28 meq/L (ref 19–32)
CREATININE: 0.91 mg/dL (ref 0.40–1.20)
GFR: 73.18 mL/min (ref >60.00)
GLUCOSE: 81 mg/dL (ref 70–99)
Potassium: 4.4 mEq/L (ref 3.5–5.1)
SODIUM: 138 meq/L (ref 135–145)
Total Protein: 7.1 g/dL (ref 6.0–8.3)

## 2017-09-05 LAB — TSH: TSH: 3.54 u[IU]/mL (ref 0.35–4.50)

## 2017-09-05 LAB — T4, FREE: Free T4: 0.92 ng/dL (ref 0.60–1.60)

## 2017-09-05 NOTE — Assessment & Plan Note (Signed)
Controlled on Albuterol Will monitor

## 2017-09-05 NOTE — Progress Notes (Signed)
Subjective:    Patient ID: Margaret Frederick, female    DOB: 03/24/1981, 37 y.o.   MRN: [id number]  HPI  Pt presents to the clinic today for her annual exam.  Hypothyroidism: She denies any issues on her current dose of Levothyroxine. She is due for repeat labs today.  Asthma: Mild, intermittent. Controlled with prn Albuterol.  Flu: 12/2015 Tetanus: 03/2016 Pap Smear:09/2014 Dentist: biannually  Diet: She does eat meat. She consumes fruits and veggies daily. She occasionally eats fried foods. She drinks mostly water and coffee. Exercise: None  Review of Systems  Past Medical History:  Diagnosis Date  . Thyroid disease     Current Outpatient Medications  Medication Sig Dispense Refill  . albuterol (PROVENTIL HFA;VENTOLIN HFA) 108 (90 Base) MCG/ACT inhaler Inhale 2 puffs into the lungs every 6 (six) hours as needed for wheezing or shortness of breath. 1 Inhaler 0  . levothyroxine (SYNTHROID, LEVOTHROID) 75 MCG tablet Take 1 tablet (75 mcg total) by mouth daily before breakfast. 30 tablet 11   No current facility-administered medications for this visit.     No Known Allergies  No family history on file.  Social History   Socioeconomic History  . Marital status: Married    Spouse name: Not on file  . Number of children: 3  . Years of education: Not on file  . Highest education level: Not on file  Occupational History  . Not on file  Social Needs  . Financial resource strain: Not on file  . Food insecurity:    Worry: Not on file    Inability: Not on file  . Transportation needs:    Medical: Not on file    Non-medical: Not on file  Tobacco Use  . Smoking status: Never Smoker  . Smokeless tobacco: Never Used  Substance and Sexual Activity  . Alcohol use: No  . Drug use: Not on file  . Sexual activity: Not on file  Lifestyle  . Physical activity:    Days per week: Not on file    Minutes per session: Not on file  . Stress: Not on file  Relationships  . Social  connections:    Talks on phone: Not on file    Gets together: Not on file    Attends religious service: Not on file    Active member of club or organization: Not on file    Attends meetings of clubs or organizations: Not on file    Relationship status: Not on file  . Intimate partner violence:    Fear of current or ex partner: Not on file    Emotionally abused: Not on file    Physically abused: Not on file    Forced sexual activity: Not on file  Other Topics Concern  . Not on file  Social History Narrative  . Not on file     Constitutional: Denies fever, malaise, fatigue, headache or abrupt weight changes.  HEENT: Denies eye pain, eye redness, ear pain, ringing in the ears, wax buildup, runny nose, nasal congestion, bloody nose, or sore throat. Respiratory: Denies difficulty breathing, shortness of breath, cough or sputum production.   Cardiovascular: Denies chest pain, chest tightness, palpitations or swelling in the hands or feet.  Gastrointestinal: Denies abdominal pain, bloating, constipation, diarrhea or blood in the stool.  GU: Denies urgency, frequency, pain with urination, burning sensation, blood in urine, odor or discharge. Musculoskeletal: Denies decrease in range of motion, difficulty with gait, muscle pain or joint pain and swelling.  Skin: Denies redness, rashes, lesions or ulcercations.  Neurological: Denies dizziness, difficulty with memory, difficulty with speech or problems with balance and coordination.  Psych: Denies anxiety, depression, SI/HI.  No other specific complaints in a complete review of systems (except as listed in HPI above).     Objective:   Physical Exam   BP 112/76   Pulse 64   Temp 97.9 F (36.6 C) (Oral)   Ht 5\' 7"  (1.702 m)   Wt 151 lb (68.5 kg)   LMP 08/24/2017   SpO2 98%   BMI 23.65 kg/m  Wt Readings from Last 3 Encounters:  09/05/17 151 lb (68.5 kg)  09/04/16 161 lb 8 oz (73.3 kg)  08/31/15 135 lb 12 oz (61.6 kg)     General: Appears her stated age, well developed, well nourished in NAD. Skin: Warm, dry and intact.  HEENT: Head: normal shape and size; Eyes: sclera white, no icterus, conjunctiva pink, PERRLA and EOMs intact; Ears: Tm's gray and intact, normal light reflex;  Throat/Mouth: Teeth present, mucosa pink and moist, no exudate, lesions or ulcerations noted.  Neck:  Neck supple, trachea midline. No masses, lumps  present.  Cardiovascular: Normal rate and rhythm. S1,S2 noted.  No murmur, rubs or gallops noted. No JVD or BLE edema.  Pulmonary/Chest: Normal effort and positive vesicular breath sounds. No respiratory distress. No wheezes, rales or ronchi noted.  Abdomen: Soft and nontender. Normal bowel sounds. No distention or masses noted. Liver, spleen and kidneys non palpable. Pelvic: Normal female anatomy. Cervix without changes. No CMT noted. Adnexa non palpable. Musculoskeletal: Strength 5/5 BUE/BLE No difficulty with gait.  Neurological: Alert and oriented. Cranial nerves II-XII grossly intact. Coordination normal.  Psychiatric: Mood and affect normal. Behavior is normal. Judgment and thought content normal.    BMET    Component Value Date/Time   NA 138 09/04/2016 1458   K 4.3 09/04/2016 1458   CL 103 09/04/2016 1458   CO2 28 09/04/2016 1458   GLUCOSE 87 09/04/2016 1458   BUN 17 09/04/2016 1458   CREATININE 1.00 09/04/2016 1458   CREATININE 1.01 10/16/2014 1521   CALCIUM 10.0 09/04/2016 1458   GFRNONAA 76.62 10/11/2009 1709    Lipid Panel     Component Value Date/Time   CHOL 210 (H) 09/04/2016 1458   TRIG 116.0 09/04/2016 1458   HDL 64.80 09/04/2016 1458   CHOLHDL 3 09/04/2016 1458   VLDL 23.2 09/04/2016 1458   LDLCALC 122 (H) 09/04/2016 1458    CBC    Component Value Date/Time   WBC 6.9 09/04/2016 1458   RBC 4.80 09/04/2016 1458   HGB 13.8 09/04/2016 1458   HCT 41.1 09/04/2016 1458   PLT 343.0 09/04/2016 1458   MCV 85.8 09/04/2016 1458   MCH 29.9 10/16/2014 1521    MCHC 33.5 09/04/2016 1458   RDW 15.5 09/04/2016 1458   LYMPHSABS 1.9 10/11/2009 1709   MONOABS 0.5 10/11/2009 1709   EOSABS 0.2 10/11/2009 1709   BASOSABS 0.0 10/11/2009 1709    Hgb A1C No results found for: HGBA1C         Assessment & Plan:   Preventative Health Maintenance:  Encouraged her to get a flu shot in the fall Tetanus UTD Pap smear today with STD screening Encouraged her to consume a balanced diet and exercise regimen Advised her to see a dentist annually Will check CBC, CMET, TSH, Free T4 and lipid profile today  RTC in 1 year, sooner if needed Nicki Reaper, NP

## 2017-09-05 NOTE — Assessment & Plan Note (Signed)
Will check TSH and Free T4 today Will adjust Synthroid if needed based on labs 

## 2017-09-05 NOTE — Patient Instructions (Signed)

## 2017-09-06 LAB — CYTOLOGY - PAP
Bacterial vaginitis: NEGATIVE
CANDIDA VAGINITIS: NEGATIVE
CHLAMYDIA, DNA PROBE: NEGATIVE
Diagnosis: NEGATIVE
Neisseria Gonorrhea: NEGATIVE
TRICH (WINDOWPATH): NEGATIVE

## 2017-10-25 ENCOUNTER — Other Ambulatory Visit: Payer: Self-pay | Admitting: Internal Medicine

## 2018-06-18 ENCOUNTER — Telehealth: Payer: Self-pay

## 2018-06-18 NOTE — Telephone Encounter (Signed)
Spoke with patient who states she has hypothyroidism and has noticed a lump in her throat the past few weeks that has given her some discomfort. She did not confirm or deny if lump is getting larger in size. No difficulty breathing not really choking just feels something is there. No other symptoms. Advised patient that we would need to see her to evaluate and do a physical exam for this (unless provider felt different about this). Patient wanted to try and not come in if she can help it but understood. She will speak with her husband about what she should do and call us back if she wants to come in. Advised patient that she could also wait, if she wanted to, when things calm down with virus if she would feel better about that. Patient understood and expressed thanks for talking and will call us back to let us know how she would like to proceed. Patient does not want to be exposed.  Denies fever, sore throat, SOB or any other URI symptoms. Advised patient would send this to PCP as an FYI.

## 2018-06-19 NOTE — Telephone Encounter (Signed)
Can do a webex visit

## 2018-06-20 ENCOUNTER — Encounter: Payer: Self-pay | Admitting: Internal Medicine

## 2018-06-20 ENCOUNTER — Other Ambulatory Visit: Payer: Self-pay

## 2018-06-20 ENCOUNTER — Ambulatory Visit (INDEPENDENT_AMBULATORY_CARE_PROVIDER_SITE_OTHER): Payer: BLUE CROSS/BLUE SHIELD | Admitting: Internal Medicine

## 2018-06-20 DIAGNOSIS — R221 Localized swelling, mass and lump, neck: Secondary | ICD-10-CM | POA: Diagnosis not present

## 2018-06-20 DIAGNOSIS — E039 Hypothyroidism, unspecified: Secondary | ICD-10-CM | POA: Diagnosis not present

## 2018-06-20 NOTE — Progress Notes (Signed)
Virtual Visit via Video Note  I connected with Margaret Frederick on 06/20/18 at  2:15 PM EDT by a video enabled telemedicine application and verified that I am speaking with the correct person using two identifiers.   I discussed the limitations of evaluation and management by telemedicine and the availability of in person appointments. The patient expressed understanding and agreed to proceed.  The [employer] took place in my office at [place], and [employer] at home.  History of Present Illness: Pt reports that over the last few weeks, she has noticed a lump in her throat. The lump is uncomfortable, but she denies difficulty passing liquid or solid food, no difficulty breathing. She denies runny nose, nasal congestion, sore throat, cough or SOB. She denies nausea, vomiting, reflux or abdominal pain. She has not taken anything OTC for this. She has a history of hypothyroidism, her last thyroid ultrasound was 09/2014.    Observations/Objective: Alert and oriented. NAD. No visible lumps or masses noted of the thyroid. Speech is normal, no hoarseness noted.   Assessment and Plan:  Mass of Neck/Hypothyroidism:  She is concerned about thyroid nodule Will obtain TSH and Free T4- she will make a lab only appt Will obtain thyroid ultrasound- she understands someone will call her to set this up Continue Levothyroxine dose for now- will adjust if needed based on labs If thyroid ultrasound shows no nodules, consider cervical adenopathy vs globus sensation Would consider RX for PPI, referral to GI for upper GI  Follow Up Instructions:    I discussed the assessment and treatment plan with the patient. The patient was provided an opportunity to ask questions and all were answered. The patient agreed with the plan and demonstrated an understanding of the instructions.   The patient was advised to call back or seek an in-person evaluation if the symptoms worsen or if the condition fails to  improve as anticipated.  I provided 12 minutes of non-face-to-face time during this encounter.   Nicki Reaper, NP

## 2018-06-20 NOTE — Patient Instructions (Signed)
Thyroid Nodule    A thyroid nodule is an isolated growth of thyroid cells that forms a lump in your thyroid gland. The thyroid gland is a butterfly-shaped gland. It is found in the lower front of your neck. This gland sends chemical messengers (hormones) through your blood to all parts of your body. These hormones are important in regulating your body temperature and helping your body to use energy. Thyroid nodules are common. Most are not cancerous (are benign). You may have one nodule or several nodules.  Different types of thyroid nodules include:  · Nodules that grow and fill with fluid (thyroid cysts).  · Nodules that produce too much thyroid hormone (hot nodules or hyperthyroid).  · Nodules that produce no thyroid hormone (cold nodules or hypothyroid).  · Nodules that form from cancer cells (thyroid cancers).  What are the causes?  Usually, the cause of this condition is not known.  What increases the risk?  Factors that make this condition more likely to develop include:  · Increasing age. Thyroid nodules become more common in people who are older than 40 years of age.  · Gender.  ? Benign thyroid nodules are more common in women.  ? Cancerous (malignant) thyroid nodules are more common in men.  · A family history that includes:  ? Thyroid nodules.  ? Pheochromocytoma.  ? Thyroid carcinoma.  ? Hyperparathyroidism.  · Certain kinds of thyroid diseases, such as Hashimoto thyroiditis.  · Lack of iodine.  · A history of head and neck radiation, such as from X-rays.  What are the signs or symptoms?  It is common for this condition to cause no symptoms. If you have symptoms, they may include:  · A lump in your lower neck.  · Feeling a lump or tickle in your throat.  · Pain in your neck, jaw, or ear.  · Having trouble swallowing.  Hot nodules may cause symptoms that include:  · Weight loss.  · Warm, flushed skin.  · Feeling hot.  · Feeling nervous.  · A racing heartbeat.  Cold nodules may cause symptoms that  include:  · Weight gain.  · Dry skin.  · Brittle hair. This may also occur with hair loss.  · Feeling cold.  · Fatigue.  Thyroid cancer nodules may cause symptoms that include:  · Hard nodules that feel stuck to the thyroid gland.  · Hoarseness.  · Lumps in the glands near your thyroid (lymph nodes).  How is this diagnosed?  A thyroid nodule may be felt by your health care provider during a physical exam. This condition may also be diagnosed based on your symptoms. You may also have tests, including:  · An ultrasound. This may be done to confirm the diagnosis.  · A biopsy. This involves taking a sample from the nodule and looking at it under a microscope to see if the nodule is benign.  · Blood tests to make sure that your thyroid is working properly.  · Imaging tests such as MRI or CT scan may be done if:  ? Your nodule is large.  ? Your nodule is blocking your airway.  ? Cancer is suspected.  How is this treated?  Treatment depends on the cause and size of your nodule or nodules. If the nodule is benign, treatment may not be necessary. Your health care provider may monitor the nodule to see if it goes away without treatment. If the nodule continues to grow, is cancerous, or does not go away:  ·   It may need to be drained with a needle.  · It may need to be removed with surgery.  If you have surgery, part or all of your thyroid gland may need to be removed as well.  Follow these instructions at home:  · Pay attention to any changes in your nodule.  · Take over-the-counter and prescription medicines only as told by your health care provider.  · Keep all follow-up visits as told by your health care provider. This is important.  Contact a health care provider if:  · Your voice changes.  · You have trouble swallowing.  · You have pain in your neck, ear, or jaw that is getting worse.  · Your nodule gets bigger.  · Your nodule starts to make it harder for you to breathe.  Get help right away if:  · You have a sudden  fever.  · You feel very weak.  · Your muscles look like they are shrinking (muscle wasting).  · You have mood swings.  · You feel very restless.  · You feel confused.  · You are seeing or hearing things that other people do not see or hear (having hallucinations).  · You feel suddenly nauseous or throw up.  · You suddenly have diarrhea.  · You have chest pain.  · There is a loss of consciousness.  This information is not intended to replace advice given to you by your health care provider. Make sure you discuss any questions you have with your health care provider.  Document Released: 02/04/2004 Document Revised: 11/14/2015 Document Reviewed: 06/24/2014  Elsevier Interactive Patient Education © 2019 [employer]

## 2018-06-20 NOTE — Telephone Encounter (Signed)
Pt has webx visit scheduled for today

## 2018-06-21 ENCOUNTER — Other Ambulatory Visit (INDEPENDENT_AMBULATORY_CARE_PROVIDER_SITE_OTHER): Payer: BLUE CROSS/BLUE SHIELD

## 2018-06-21 ENCOUNTER — Other Ambulatory Visit: Payer: Self-pay

## 2018-06-21 DIAGNOSIS — E039 Hypothyroidism, unspecified: Secondary | ICD-10-CM

## 2018-06-21 DIAGNOSIS — R221 Localized swelling, mass and lump, neck: Secondary | ICD-10-CM

## 2018-06-21 LAB — T4, FREE: Free T4: 1.12 ng/dL (ref 0.60–1.60)

## 2018-06-21 LAB — TSH: TSH: 1.45 u[IU]/mL (ref 0.35–4.50)

## 2018-07-02 ENCOUNTER — Telehealth: Payer: Self-pay

## 2018-07-02 DIAGNOSIS — E039 Hypothyroidism, unspecified: Secondary | ICD-10-CM

## 2018-07-02 DIAGNOSIS — R221 Localized swelling, mass and lump, neck: Secondary | ICD-10-CM

## 2018-07-02 NOTE — Telephone Encounter (Signed)
I do not see where we have received the results  Results below copied from [facility], Rad Results In - 06/28/2018  2:03 PM EDT EXAM: US THYROID DATE: 06/28/2018 11:41 AM ACCESSION: [id number] UN DICTATED: 06/28/2018 11:42 AM INTERPRETATION LOCATION: [facility]  CLINICAL INDICATION: 38 year old Female with OTHER - R22.1 - Neck mass   COMPARISON: None  TECHNIQUE:  Ultrasound views of the thyroid were obtained using gray scale and limited color Doppler imaging.  FINDINGS: Thyroid size: see below  Thyroid echotexture: Mildly heterogeneous and mildly small in size. Normal vascular perfusion throughout. No suspicious thyroid nodules.  Lymph nodes: Scattered subcentimeter, normal-appearing lymph nodes throughout the neck bilaterally.  IMPRESSION: Mildly heterogeneous thyroid with mildly small size.  No discrete thyroid nodules. No obvious focal lesion in the visualized neck.

## 2018-07-02 NOTE — Telephone Encounter (Signed)
Pt left v/m requesting cb with thyroid US results.

## 2018-07-03 NOTE — Addendum Note (Signed)
Addended by: Lorre Munroe on: 07/03/2018 04:20 PM   Modules accepted: Orders

## 2018-07-03 NOTE — Telephone Encounter (Signed)
Pt is aware as instructed... pt would like to have referral to GI and [facility] Dr Leretha Pol... I explained to pt her labs are normal that Endo will not likely address the mass etc... pt still insisted on referral to Endo

## 2018-07-03 NOTE — Telephone Encounter (Signed)
Referrals placed 

## 2018-07-03 NOTE — Telephone Encounter (Signed)
This was a paper result, should be in your inbox. Normal exam. If she continues to have the sensation, we can refer to GI for further evaluation, possible upper endoscopy.

## 2018-07-12 ENCOUNTER — Telehealth: Payer: Self-pay | Admitting: Internal Medicine

## 2018-07-12 NOTE — Telephone Encounter (Signed)
Received call from [facility]. They said they dont usually see patients with a Normal TSH. They asked [place] to fax over any additional information or underlying reasons that she would need to see their Physician.Please advise if there is anything else we can tell them by phone if nothing else to fax.

## 2018-07-12 NOTE — Telephone Encounter (Signed)
Advise pt they would not see her. I can refer to GI (if I haven't already) for them to further evaluate her. Let me know what she would like to do.

## 2018-08-02 NOTE — Telephone Encounter (Signed)
Left detailed msg on VM per HIPAA  

## 2018-09-09 ENCOUNTER — Encounter: Payer: BLUE CROSS/BLUE SHIELD | Admitting: Internal Medicine

## 2018-11-14 ENCOUNTER — Other Ambulatory Visit: Payer: Self-pay | Admitting: Internal Medicine
